# Patient Record
Sex: Female | Born: 1970 | Race: White | Hispanic: No | Marital: Married | Smoking: Former smoker
Health system: Southern US, Community
[De-identification: ages and names within clinical notes are randomized; demographics above are authoritative.]

## PROBLEM LIST (undated history)

## (undated) DIAGNOSIS — Z8719 Personal history of other diseases of the digestive system: Secondary | ICD-10-CM

## (undated) DIAGNOSIS — D249 Benign neoplasm of unspecified breast: Secondary | ICD-10-CM

## (undated) DIAGNOSIS — T7840XA Allergy, unspecified, initial encounter: Secondary | ICD-10-CM

## (undated) DIAGNOSIS — T4145XA Adverse effect of unspecified anesthetic, initial encounter: Secondary | ICD-10-CM

## (undated) DIAGNOSIS — K219 Gastro-esophageal reflux disease without esophagitis: Secondary | ICD-10-CM

## (undated) DIAGNOSIS — T8859XA Other complications of anesthesia, initial encounter: Secondary | ICD-10-CM

## (undated) HISTORY — PX: MANDIBLE SURGERY: SHX707

## (undated) HISTORY — PX: BREAST EXCISIONAL BIOPSY: SUR124

## (undated) HISTORY — DX: Allergy, unspecified, initial encounter: T78.40XA

## (undated) HISTORY — DX: Benign neoplasm of unspecified breast: D24.9

## (undated) HISTORY — DX: Gastro-esophageal reflux disease without esophagitis: K21.9

---

## 1999-03-02 ENCOUNTER — Other Ambulatory Visit: Admission: RE | Admit: 1999-03-02 | Discharge: 1999-03-02 | Payer: Self-pay | Admitting: Obstetrics and Gynecology

## 1999-07-09 HISTORY — PX: EYE SURGERY: SHX253

## 1999-07-18 ENCOUNTER — Encounter (INDEPENDENT_AMBULATORY_CARE_PROVIDER_SITE_OTHER): Payer: Self-pay | Admitting: Specialist

## 1999-07-18 ENCOUNTER — Ambulatory Visit (HOSPITAL_COMMUNITY): Admission: RE | Admit: 1999-07-18 | Discharge: 1999-07-18 | Payer: Self-pay | Admitting: *Deleted

## 1999-12-28 ENCOUNTER — Encounter: Admission: RE | Admit: 1999-12-28 | Discharge: 1999-12-28 | Payer: Self-pay | Admitting: Emergency Medicine

## 1999-12-28 ENCOUNTER — Encounter: Payer: Self-pay | Admitting: Emergency Medicine

## 2000-03-27 ENCOUNTER — Other Ambulatory Visit: Admission: RE | Admit: 2000-03-27 | Discharge: 2000-03-27 | Payer: Self-pay | Admitting: Obstetrics and Gynecology

## 2000-05-18 ENCOUNTER — Encounter: Payer: Self-pay | Admitting: Obstetrics and Gynecology

## 2000-05-18 ENCOUNTER — Inpatient Hospital Stay (HOSPITAL_COMMUNITY): Admission: AD | Admit: 2000-05-18 | Discharge: 2000-05-18 | Payer: Self-pay | Admitting: Obstetrics and Gynecology

## 2000-10-03 ENCOUNTER — Inpatient Hospital Stay (HOSPITAL_COMMUNITY): Admission: AD | Admit: 2000-10-03 | Discharge: 2000-10-05 | Payer: Self-pay | Admitting: Obstetrics & Gynecology

## 2000-11-05 ENCOUNTER — Other Ambulatory Visit: Admission: RE | Admit: 2000-11-05 | Discharge: 2000-11-05 | Payer: Self-pay | Admitting: Obstetrics and Gynecology

## 2001-11-10 ENCOUNTER — Other Ambulatory Visit: Admission: RE | Admit: 2001-11-10 | Discharge: 2001-11-10 | Payer: Self-pay | Admitting: Obstetrics and Gynecology

## 2002-06-02 ENCOUNTER — Encounter: Payer: Self-pay | Admitting: *Deleted

## 2002-06-02 ENCOUNTER — Other Ambulatory Visit: Admission: RE | Admit: 2002-06-02 | Discharge: 2002-06-02 | Payer: Self-pay | Admitting: Obstetrics and Gynecology

## 2002-06-02 ENCOUNTER — Encounter: Admission: RE | Admit: 2002-06-02 | Discharge: 2002-06-02 | Payer: Self-pay | Admitting: *Deleted

## 2002-11-15 ENCOUNTER — Other Ambulatory Visit: Admission: RE | Admit: 2002-11-15 | Discharge: 2002-11-15 | Payer: Self-pay | Admitting: Obstetrics and Gynecology

## 2003-07-09 HISTORY — PX: TUBAL LIGATION: SHX77

## 2003-09-07 ENCOUNTER — Inpatient Hospital Stay (HOSPITAL_COMMUNITY): Admission: AD | Admit: 2003-09-07 | Discharge: 2003-09-07 | Payer: Self-pay | Admitting: Obstetrics and Gynecology

## 2003-09-16 ENCOUNTER — Inpatient Hospital Stay (HOSPITAL_COMMUNITY): Admission: AD | Admit: 2003-09-16 | Discharge: 2003-09-16 | Payer: Self-pay | Admitting: Obstetrics and Gynecology

## 2003-09-23 ENCOUNTER — Inpatient Hospital Stay (HOSPITAL_COMMUNITY): Admission: AD | Admit: 2003-09-23 | Discharge: 2003-09-25 | Payer: Self-pay | Admitting: Obstetrics and Gynecology

## 2003-09-24 ENCOUNTER — Encounter (INDEPENDENT_AMBULATORY_CARE_PROVIDER_SITE_OTHER): Payer: Self-pay | Admitting: Specialist

## 2003-10-28 ENCOUNTER — Other Ambulatory Visit: Admission: RE | Admit: 2003-10-28 | Discharge: 2003-10-28 | Payer: Self-pay | Admitting: Obstetrics and Gynecology

## 2004-11-19 ENCOUNTER — Other Ambulatory Visit: Admission: RE | Admit: 2004-11-19 | Discharge: 2004-11-19 | Payer: Self-pay | Admitting: Obstetrics and Gynecology

## 2005-07-05 ENCOUNTER — Encounter: Admission: RE | Admit: 2005-07-05 | Discharge: 2005-07-05 | Payer: Self-pay | Admitting: Obstetrics and Gynecology

## 2006-05-06 ENCOUNTER — Encounter: Admission: RE | Admit: 2006-05-06 | Discharge: 2006-05-06 | Payer: Self-pay | Admitting: Family Medicine

## 2007-01-16 ENCOUNTER — Encounter: Admission: RE | Admit: 2007-01-16 | Discharge: 2007-01-16 | Payer: Self-pay | Admitting: Obstetrics and Gynecology

## 2010-11-21 ENCOUNTER — Other Ambulatory Visit: Payer: Self-pay | Admitting: Gastroenterology

## 2010-11-23 NOTE — Op Note (Signed)
NAME:  [name], Erin L                              ACCOUNT NO.:  [id number]   MEDICAL RECORD NO.:  [id number]                   PATIENT TYPE:  INP   LOCATION:  9110                                 FACILITY:  [facility]   PHYSICIAN:  Dineen Kid. Rana Snare, M.D.                 DATE OF BIRTH:  [birth year]   DATE OF PROCEDURE:  09/24/2003  DATE OF DISCHARGE:  09/25/2003                                 OPERATIVE REPORT   PREOPERATIVE DIAGNOSIS:  Multiparity, desired sterility.   POSTOPERATIVE DIAGNOSIS:  Multiparity, desired sterility.   PROCEDURE:  Modified Pomeroy bilateral tubal ligation.   SURGEON:  Dr. Candice Camp.   ANESTHESIA:  Epidural.   INDICATIONS:  [name] is a 40 year old G3 now P3 status post vaginal  delivery yesterday of an uncomplicated birth of a baby boy.  She and her  husband adamantly desire sterilization and she presents for this today.  Risks and benefits were discussed at length which include but were not  limited to risk of infection; bleeding; damage to uterus, tubes, ovaries,  bowel, and bladder.  Risk of tubal failure quoted at 11/998.  She gives her  informed consent.   DESCRIPTION OF PROCEDURE:  After adequate analgesia the patient was placed  in the supine position.  She was sterilely prepped and draped.  The incision  site was infiltrated with 0.25% Marcaine 9 mL.  A 2 cm infraumbilical skin  incision was made and taken down sharply to the fascia, incised  transversely.  The peritoneum was entered sharply.  [employer] retractors  were placed.  The right fallopian tube was identified, grasped with a  Babcock clamp, confirmed by the fimbriated end.  The midportion of the tube  was then grabbed with the Babcock, doubly ligated with 0 plain suture, and  then suture of 2-0 silk on the proximal section.  The central portion was  excised, noted to be hemostatic.  The left fallopian tube was identified,  confirmed by the fimbriated end.  The midportion of the tube was grasped,  doubly ligated with 0 plain suture and 2-0 silk suture on the proximal  section.  The midportion was then excised and sent to pathology.  Hemostasis  was achieved.  The fascia was then closed with a #1 Vicryl in a running  fashion.  The skin was then closed with a 3-0 Vicryl Rapide subcuticular  stitch with good approximation and good hemostasis.  The patient tolerated  the procedure well, was stable on transfer to the recovery room.  The  estimated blood loss was less than 10 mL.  There were no complications.                                               Dineen Kid  Rana Snare, M.D.    DCL/MEDQ  D:  09/24/2003  T:  09/26/2003  Job:  [id number]

## 2012-01-16 ENCOUNTER — Ambulatory Visit: Payer: Self-pay | Admitting: Gynecology

## 2012-02-06 ENCOUNTER — Ambulatory Visit: Payer: Self-pay | Admitting: Gynecology

## 2012-02-06 DIAGNOSIS — D249 Benign neoplasm of unspecified breast: Secondary | ICD-10-CM

## 2012-02-06 HISTORY — DX: Benign neoplasm of unspecified breast: D24.9

## 2012-02-13 ENCOUNTER — Other Ambulatory Visit: Payer: Self-pay | Admitting: Gynecology

## 2012-02-13 ENCOUNTER — Encounter: Payer: Self-pay | Admitting: Gynecology

## 2012-02-13 ENCOUNTER — Other Ambulatory Visit (HOSPITAL_COMMUNITY)
Admission: RE | Admit: 2012-02-13 | Discharge: 2012-02-13 | Disposition: A | Discharge status: Home / Self Care | Payer: BC Managed Care – PPO | Source: Ambulatory Visit | Attending: Gynecology | Admitting: Gynecology

## 2012-02-13 ENCOUNTER — Ambulatory Visit (INDEPENDENT_AMBULATORY_CARE_PROVIDER_SITE_OTHER): Payer: BC Managed Care – PPO | Admitting: Gynecology

## 2012-02-13 VITALS — BP 108/64 | Ht 61.0 in | Wt 143.0 lb

## 2012-02-13 DIAGNOSIS — Z01419 Encounter for gynecological examination (general) (routine) without abnormal findings: Secondary | ICD-10-CM

## 2012-02-13 DIAGNOSIS — Z1151 Encounter for screening for human papillomavirus (HPV): Secondary | ICD-10-CM | POA: Insufficient documentation

## 2012-02-13 DIAGNOSIS — Z131 Encounter for screening for diabetes mellitus: Secondary | ICD-10-CM

## 2012-02-13 DIAGNOSIS — N926 Irregular menstruation, unspecified: Secondary | ICD-10-CM

## 2012-02-13 DIAGNOSIS — Z13220 Encounter for screening for lipoid disorders: Secondary | ICD-10-CM

## 2012-02-13 DIAGNOSIS — Z1231 Encounter for screening mammogram for malignant neoplasm of breast: Secondary | ICD-10-CM

## 2012-02-13 LAB — CBC WITH DIFFERENTIAL/PLATELET
Basophils Relative: 1 % (ref 0–1)
Eosinophils Relative: 1 % (ref 0–5)
HCT: 40 % (ref 36.0–46.0)
Hemoglobin: 13.6 g/dL (ref 12.0–15.0)
MCH: 28.8 pg (ref 26.0–34.0)
MCHC: 34 g/dL (ref 30.0–36.0)
MCV: 84.7 fL (ref 78.0–100.0)
Monocytes Absolute: 0.4 10*3/uL (ref 0.1–1.0)
Monocytes Relative: 6 % (ref 3–12)
Neutro Abs: 4.4 10*3/uL (ref 1.7–7.7)

## 2012-02-13 LAB — LIPID PANEL
Cholesterol: 177 mg/dL (ref 0–200)
Total CHOL/HDL Ratio: 3.9 Ratio
VLDL: 27 mg/dL (ref 0–40)

## 2012-02-13 LAB — PROLACTIN: Prolactin: 6.1 ng/mL

## 2012-02-13 LAB — FOLLICLE STIMULATING HORMONE: FSH: 9.8 m[IU]/mL

## 2012-02-13 MED ORDER — ALPRAZOLAM 0.5 MG PO TABS: 0.5000 mg | ORAL_TABLET | Freq: Every evening | ORAL | Status: AC | PRN

## 2012-02-13 NOTE — Patient Instructions (Signed)
Follow up for ultrasound as scheduled 

## 2012-02-13 NOTE — Progress Notes (Signed)
Erin [name] 1970/09/25 [id number]        41 y.o.  G3P3 for annual exam.  Has not been in this office for a number of years. Several issues noted below.  Past medical history,surgical history, medications, allergies, family history and social history were all reviewed and documented in the EPIC chart. ROS:  Was performed and pertinent positives and negatives are included in the history.  Exam: Sherrilyn Rist assistant Filed Vitals:   02/13/12 0916  BP: 108/64  Height: 5\' 1"  (1.549 m)  Weight: 143 lb (64.864 kg)   General appearance  Normal Skin grossly normal Head/Neck normal with no cervical or supraclavicular adenopathy thyroid normal Lungs  clear Cardiac RR, without RMG Abdominal  soft, nontender, without masses, organomegaly or hernia Breasts  examined lying and sitting without masses, retractions, discharge or axillary adenopathy. Pelvic  Ext/BUS/vagina  normal   Cervix  normal Pap/HPV  Uterus  anteverted, normal size, shape and contour, midline and mobile nontender   Adnexa  Without masses or tenderness    Anus and perineum  normal   Rectovaginal  normal sphincter tone without palpated masses or tenderness.    Assessment/Plan:  41 y.o. G3P3 female for annual exam, BTL postpartum.  1. Irregular menses. Patient notes that her menses are becoming more irregular with a will vary as to when they start each month. She does have them monthly lasting 3-5 days. They do vary in flow from month to month. No intermenstrual bleeding. Status post BTL. We'll check baseline labs to include FSH TSH prolactin.  2. History right ovarian cyst. Patient does note her periods will vary in discomfort from mild to significant dysmenorrhea. History of intermittent pelvic pain that she attributes to recurrent ovarian cysts. No dyspareunia or consistent chronic pain. We'll start with ultrasound. Given the irregular menses history also will do sonohysterogram for pelvic assessment. 3. PMS. Patient notes the week before  her menses increasing short temper and anxiety. Had been on Xanax in the past which seemed to help. Options to include behavior modification, intermittent Xanax, daily anxiolytic such as fluoxetine all reviewed. Patient wants to retry Xanax I prescribed Xanax 0.5 mg, one half tab every 6 hours when necessary #30 with 1 refill. 4. Breast health. Overdue for mammogram and knows to schedule this and agrees to call to do so. SBE monthly reviewed. 5. Pap smear. Several years since her last Pap smear. Pap/HPV done. She has no history of abnormal Pap smears before that this is normal we'll plan every 5 year screening per current screening guidelines. 6. Health maintenance. Baseline CBC lipid profile glucose urinalysis ordered with the above labwork. Follow up for ultrasound and lab work results.   Dara Lords MD, 9:57 AM 02/13/2012

## 2012-02-14 LAB — URINALYSIS W MICROSCOPIC + REFLEX CULTURE
Bacteria, UA: NONE SEEN
Bilirubin Urine: NEGATIVE
Ketones, ur: NEGATIVE mg/dL
Protein, ur: NEGATIVE mg/dL
Urobilinogen, UA: 0.2 mg/dL (ref 0.0–1.0)

## 2012-02-18 ENCOUNTER — Ambulatory Visit
Admission: RE | Admit: 2012-02-18 | Discharge: 2012-02-18 | Disposition: A | Discharge status: Home / Self Care | Payer: BC Managed Care – PPO | Source: Ambulatory Visit | Attending: Gynecology | Admitting: Gynecology

## 2012-02-18 DIAGNOSIS — Z1231 Encounter for screening mammogram for malignant neoplasm of breast: Secondary | ICD-10-CM

## 2012-02-20 ENCOUNTER — Other Ambulatory Visit: Payer: Self-pay | Admitting: Gynecology

## 2012-02-20 ENCOUNTER — Ambulatory Visit (INDEPENDENT_AMBULATORY_CARE_PROVIDER_SITE_OTHER): Payer: BC Managed Care – PPO

## 2012-02-20 ENCOUNTER — Ambulatory Visit (INDEPENDENT_AMBULATORY_CARE_PROVIDER_SITE_OTHER): Payer: BC Managed Care – PPO | Admitting: Gynecology

## 2012-02-20 ENCOUNTER — Encounter: Payer: Self-pay | Admitting: Gynecology

## 2012-02-20 DIAGNOSIS — N926 Irregular menstruation, unspecified: Secondary | ICD-10-CM

## 2012-02-20 DIAGNOSIS — N831 Corpus luteum cyst of ovary, unspecified side: Secondary | ICD-10-CM

## 2012-02-20 DIAGNOSIS — N920 Excessive and frequent menstruation with regular cycle: Secondary | ICD-10-CM

## 2012-02-20 DIAGNOSIS — N8300 Follicular cyst of ovary, unspecified side: Secondary | ICD-10-CM

## 2012-02-20 NOTE — Patient Instructions (Signed)
Office will call you with the biopsy results. Assuming negative then plan to keep a menstrual calendar. We'll see how your periods do in the next 3-6 months.

## 2012-02-20 NOTE — Progress Notes (Signed)
Patient presents for sonohysterogram due to her menses are becoming more irregular where they will vary as to when they start each month. She does have them monthly lasting 3-5 days. They do vary in flow from month to month.  She also has a history of recurrent ovarian cysts with some pelvic discomfort that comes and goes.  Ultrasound shows anteverted uterus with homogeneous myometrial echo texture. Uterine size normal. Endometrial echo 8.5 mm. Right ovary normal. Left ovary with classic corpus luteal cyst. Cul-de-sac negative. Sonohysterogram performed, sterile technique, easy catheter introduction, good distention with no abnormalities. Endometrial sample taken. Patient tolerated well.  Assessment and plan: Mild menstrual irregularity. Varies in when her menses will start each month and as far as flow. Hormone levels to include TSH prolactin FSH were normal. Sonohysterogram negative for cavity abnormalities. Patient will follow up for endometrial biopsy results. Assuming negative options for management include hormonal manipulation Mirena IUD observation reviewed. Patient would prefer observation at this point. She'll call if her menses become significantly irregular or other symptoms present.

## 2012-02-24 ENCOUNTER — Other Ambulatory Visit: Payer: Self-pay | Admitting: *Deleted

## 2012-02-24 DIAGNOSIS — R928 Other abnormal and inconclusive findings on diagnostic imaging of breast: Secondary | ICD-10-CM

## 2012-02-26 NOTE — Addendum Note (Signed)
Addended by: Aura Camps on: 02/26/2012 10:10 AM   Modules accepted: Orders

## 2012-02-27 ENCOUNTER — Other Ambulatory Visit: Payer: Self-pay | Admitting: *Deleted

## 2012-02-27 DIAGNOSIS — N63 Unspecified lump in unspecified breast: Secondary | ICD-10-CM

## 2012-02-28 ENCOUNTER — Ambulatory Visit
Admission: RE | Admit: 2012-02-28 | Discharge: 2012-02-28 | Disposition: A | Discharge status: Home / Self Care | Payer: BC Managed Care – PPO | Source: Ambulatory Visit | Attending: Gynecology | Admitting: Gynecology

## 2012-02-28 DIAGNOSIS — R928 Other abnormal and inconclusive findings on diagnostic imaging of breast: Secondary | ICD-10-CM

## 2012-03-03 ENCOUNTER — Other Ambulatory Visit: Payer: BC Managed Care – PPO

## 2012-03-05 ENCOUNTER — Telehealth: Payer: Self-pay | Admitting: *Deleted

## 2012-03-05 NOTE — Telephone Encounter (Signed)
Pt informed of all annual lab results.

## 2012-03-11 ENCOUNTER — Encounter: Payer: Self-pay | Admitting: Gynecology

## 2012-07-08 HISTORY — PX: BREAST LUMPECTOMY: SHX2

## 2012-07-08 HISTORY — PX: BREAST BIOPSY: SHX20

## 2012-08-31 ENCOUNTER — Ambulatory Visit
Admission: RE | Admit: 2012-08-31 | Discharge: 2012-08-31 | Disposition: A | Discharge status: Home / Self Care | Payer: BC Managed Care – PPO | Source: Ambulatory Visit | Attending: Gynecology | Admitting: Gynecology

## 2012-08-31 ENCOUNTER — Other Ambulatory Visit: Payer: Self-pay | Admitting: Gynecology

## 2012-08-31 ENCOUNTER — Other Ambulatory Visit: Payer: Self-pay | Admitting: *Deleted

## 2012-08-31 DIAGNOSIS — N63 Unspecified lump in unspecified breast: Secondary | ICD-10-CM

## 2012-09-02 ENCOUNTER — Ambulatory Visit
Admission: RE | Admit: 2012-09-02 | Discharge: 2012-09-02 | Disposition: A | Discharge status: Home / Self Care | Payer: BC Managed Care – PPO | Source: Ambulatory Visit | Attending: Gynecology | Admitting: Gynecology

## 2012-09-02 ENCOUNTER — Other Ambulatory Visit: Payer: Self-pay | Admitting: Gynecology

## 2012-09-02 DIAGNOSIS — N6310 Unspecified lump in the right breast, unspecified quadrant: Secondary | ICD-10-CM

## 2012-12-31 ENCOUNTER — Other Ambulatory Visit: Payer: Self-pay

## 2012-12-31 MED ORDER — ALPRAZOLAM 0.5 MG PO TABS: 0.5000 mg | ORAL_TABLET | Freq: Every evening | ORAL | Status: DC | PRN

## 2012-12-31 NOTE — Telephone Encounter (Signed)
Called into pharmacy

## 2012-12-31 NOTE — Telephone Encounter (Signed)
On 02/17/13 visit you wrote "PMS. Patient notes the week before her menses increasing short temper and anxiety. Had been on Xanax in the past which seemed to help. Options to include behavior modification, intermittent Xanax, daily anxiolytic such as fluoxetine all reviewed. Patient wants to retry Xanax I prescribed Xanax 0.5 mg, one half tab every 6 hours when necessary #30 with 1 refill."

## 2013-02-12 ENCOUNTER — Other Ambulatory Visit: Payer: Self-pay | Admitting: Gynecology

## 2013-02-12 DIAGNOSIS — N63 Unspecified lump in unspecified breast: Secondary | ICD-10-CM

## 2013-03-02 ENCOUNTER — Other Ambulatory Visit: Payer: Self-pay | Admitting: Gynecology

## 2013-03-02 ENCOUNTER — Ambulatory Visit
Admission: RE | Admit: 2013-03-02 | Discharge: 2013-03-02 | Disposition: A | Discharge status: Home / Self Care | Payer: BC Managed Care – PPO | Source: Ambulatory Visit | Attending: Gynecology | Admitting: Gynecology

## 2013-03-02 DIAGNOSIS — N63 Unspecified lump in unspecified breast: Secondary | ICD-10-CM

## 2013-03-11 ENCOUNTER — Ambulatory Visit (INDEPENDENT_AMBULATORY_CARE_PROVIDER_SITE_OTHER): Payer: Self-pay | Admitting: General Surgery

## 2013-03-12 ENCOUNTER — Other Ambulatory Visit (INDEPENDENT_AMBULATORY_CARE_PROVIDER_SITE_OTHER): Payer: Self-pay | Admitting: General Surgery

## 2013-03-12 ENCOUNTER — Ambulatory Visit (INDEPENDENT_AMBULATORY_CARE_PROVIDER_SITE_OTHER): Payer: BC Managed Care – PPO | Admitting: General Surgery

## 2013-03-12 ENCOUNTER — Encounter (INDEPENDENT_AMBULATORY_CARE_PROVIDER_SITE_OTHER): Payer: Self-pay | Admitting: General Surgery

## 2013-03-12 VITALS — BP 130/80 | HR 68 | Temp 98.4°F | Resp 14 | Ht 61.5 in | Wt 142.2 lb

## 2013-03-12 DIAGNOSIS — N6310 Unspecified lump in the right breast, unspecified quadrant: Secondary | ICD-10-CM

## 2013-03-12 DIAGNOSIS — N63 Unspecified lump in unspecified breast: Secondary | ICD-10-CM

## 2013-03-12 NOTE — Patient Instructions (Signed)
You have a palpable mass in the right breast in the lower outer quadrant. This has been slowly enlarging. It is probably not cancer, but  complete removal of this is recommended because of its enlargement.  You'll be scheduled for a right lumpectomy with needle localization in the near future.      Breast Biopsy A breast biopsy is a procedure where a sample of breast tissue is removed from your breast. The tissue is examined under a microscope to see if cancerous cells are present. A breast biopsy is done when there is:  Any undiagnosed breast mass (tumor).  Nipple abnormalities, dimpling, crusting, or ulcerations.  Abnormal discharge from the nipple, especially blood.  Redness, swelling, and pain of the breast.  Calcium deposits (calcifications) or abnormalities seen on a mammogram, ultrasound result, or results of magnetic resonance imaging (MRI).  Suspicious changes in the breast seen on your mammogram. If the tumor is found to be cancerous (malignant), a breast biopsy can help to determine what the best treatment is for you. There are many different types of breast biopsies. Talk to your caregiver about your options and which type is best for you. LET YOUR CAREGIVER KNOW ABOUT:  Allergies to food or medicine.  Medicines taken, including vitamins, herbs, eyedrops, over-the-counter medicines, and creams.  Use of steroids (by mouth or creams).  Previous problems with anesthetics or numbing medicines.  History of bleeding problems or blood clots.  Previous surgery.  Other health problems, including diabetes and kidney problems.  Any recent colds or infections.  Possibility of pregnancy, if this applies. RISKS AND COMPLICATIONS   Bleeding.  Infection.  Allergy to medicines.  Bruising and swelling of the breast.  Alteration in the shape of the breast.  Not finding the lump or abnormality.  Needing more surgery. BEFORE THE PROCEDURE  Arrange for someone to  drive you home after the procedure.  Do not smoke for 2 weeks before the procedure. Stop smoking, if you smoke.  Do not drink alcohol for 24 hours before procedure.  Wear a good support bra to the procedure. PROCEDURE  You may be given a medicine to numb the breast area (local anesthesia) or a medicine to make you sleep (general anesthesia) during the procedure. The following are the different types of biopsies that can be performed.   Fine-needle aspiration A thin needle is attached to a syringe and inserted into the breast lump. Fluid and cells are removed and then looked at under a microscope. If the breast lump cannot be felt, an ultrasound may be used to help locate the lump and place the needle in the correct area.   Core needle biopsy A wide, hollow needle (core needle) is inserted into the breast lump 3 6 times to get tissue samples or cores. The samples are removed. The needle is usually placed in the correct area by using an ultrasound or X-ray.   Stereotactic biopsy X-ray equipment and a computer are used to analyze X-ray pictures of the breast lump. The computer then finds exactly where the core needle needs to be inserted. Tissue samples are removed.   Vacuum-assisted biopsy A small incision (less than  inch) is made in your breast. A biopsy device that includes a hollow needle and vacuum is passed through the incision and into the breast tissue. The vacuum gently draws abnormal breast tissue into the needle to remove it. This type of biopsy removes a larger tissue sample than a regular core needle biopsy. No stitches are  needed, and there is usually little scarring.  Ultrasound-guided core needle biopsy A high frequency ultrasound helps guide the core needle to the area of the mass or abnormality. An incision is made to insert the needle. Tissue samples are removed.  Open biopsy A larger incision is made in the breast. Your caregiver will attempt to remove the whole breast lump  or as much as possible. AFTER THE PROCEDURE  You will be taken to the recovery area. If you are doing well and have no problems, you will be allowed to go home.  You may notice bruising on your breast. This is normal.  Your caregiver may apply a pressure dressing on your breast for 24 48 hours. A pressure dressing is a bandage that is wrapped tightly around the chest to stop fluid from collecting underneath tissues. Document Released: 06/24/2005 Document Revised: 12/24/2011 Document Reviewed: 07/25/2011 [facility] Patient Information 2014 [place], [place].

## 2013-03-12 NOTE — Progress Notes (Signed)
Patient ID: Erin [name], female   DOB: 07-Jan-1971, 42 y.o.   MRN: [id number]  Chief Complaint  Patient presents with  . New Evaluation    eval mass in rt breast    HPI Erin [name] is a 42 y.o. female.  She is referred by Dr. Konrad Saha at [facility] for evaluation and surgical management of an enlarging right breast mass at the 7:00 position.  The patient has not had any prior breast problems. 2 years ago with a solid density on her mammograms and had been washing it. 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 Six-month followup was advised and followup mammogram and ultrasound on 03/02/2013 shows slight enlargement. There is elliptical reasonably well marginated mass in the 7:00 position right breast which has gone from 2.7 x 2.1 x 1.1 cm, up to 2.8 x 2.4 x 1.6 cm.   Her only symptoms are a little bit of soreness and tenderness. No skin changes. No nipple discharge.  Family history is negative for breast or ovarian cancer.  The patient is otherwise pretty healthy. She takes Xanax for mild anxiety. She has 3 sons. Her husband is here with her today. HPI  Past Medical History  Diagnosis Date  . 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 GERD (gastroesophageal reflux disease)     Past Surgical History  Procedure Laterality Date  . Mandible surgery    . Tubal ligation  2005    postpartum    Family History  Problem Relation Age of Onset  . Hypertension Mother   . Hyperlipidemia Mother   . Diabetes Mother   . Hyperlipidemia Maternal Grandmother   . Hypertension Maternal Grandmother   . Diabetes Maternal Grandmother   . Hypertension Maternal Grandfather   . Hyperlipidemia Maternal Grandfather   . Diabetes Maternal Grandfather     Social History History  Substance Use Topics  . Smoking status: Former Smoker    Quit date: 07/09/2007    . Smokeless tobacco: Never Used     Comment: quit 2008  . Alcohol Use: Yes     Comment: very rarely    Allergies  Allergen Reactions  . Penicillins Rash    Current Outpatient Prescriptions  Medication Sig Dispense Refill  . ALPRAZolam (XANAX) 0.5 MG tablet Take 1 tablet (0.5 mg total) by mouth at bedtime as needed for sleep.  30 tablet  2   No current facility-administered medications for this visit.    Review of Systems Review of Systems  Constitutional: Negative for fever, chills and unexpected weight change.  HENT: Negative for hearing loss, congestion, sore throat, trouble swallowing and voice change.   Eyes: Negative for visual disturbance.  Respiratory: Negative for cough and wheezing.   Cardiovascular: Negative for chest pain, palpitations and leg swelling.  Gastrointestinal: Negative for nausea, vomiting, abdominal pain, diarrhea, constipation, blood in stool, abdominal distention and anal bleeding.  Genitourinary: Negative for hematuria, vaginal bleeding and difficulty urinating.  Musculoskeletal: Negative for arthralgias.  Skin: Negative for rash and wound.  Neurological: Negative for seizures, syncope and headaches.  Hematological: Negative for adenopathy. Does not bruise/bleed easily.  Psychiatric/Behavioral: Negative for confusion. The patient is nervous/anxious.     Blood pressure 130/80, pulse 68, temperature 98.4 F (36.9 C), temperature source Temporal, resp. rate 14, height 5' 1.5" (1.562 m), weight 142 lb 3.2 oz (64.501 kg).  Physical Exam Physical  Exam  Constitutional: She is oriented to person, place, and time. She appears well-developed and well-nourished. No distress.  HENT:  Head: Normocephalic and atraumatic.  Nose: Nose normal.  Mouth/Throat: No oropharyngeal exudate.  Eyes: Conjunctivae and EOM are normal. Pupils are equal, round, and reactive to light. Left eye exhibits no discharge. No scleral icterus.  Neck: Neck supple. No JVD present. No  tracheal deviation present. No thyromegaly present.  Cardiovascular: Normal rate, regular rhythm, normal heart sounds and intact distal pulses.   No murmur heard. Pulmonary/Chest: Effort normal and breath sounds normal. No respiratory distress. She has no wheezes. She has no rales. She exhibits no tenderness.    2.5 x 1.5 cm transversely elliptical mass in the right breast at the 7:00 position. No overlying skin change. No other masses in either breast. No axillary adenopathy.  Abdominal: Soft. Bowel sounds are normal. She exhibits no distension and no mass. There is no tenderness. There is no rebound and no guarding.  Musculoskeletal: She exhibits no edema and no tenderness.  Lymphadenopathy:    She has no cervical adenopathy.  Neurological: She is alert and oriented to person, place, and time. She exhibits normal muscle tone. Coordination normal.  Skin: Skin is warm. No rash noted. She is not diaphoretic. No erythema. No pallor.  Psychiatric: She has a normal mood and affect. Her behavior is normal. Judgment and thought content normal.    Data Reviewed Mammograms, ultrasound, pathology report  Assessment    Right breast mass at 7:00 position. Slowly enlarging. 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 Excision is recommended to do to enlargement, but most likely this will be benign.     Plan    Scheduled for a right lumpectomy with needle localization in the near future.  I discussed the indications, details, techniques, and numerous risk of the surgery with her and her husband. She is aware the risks of bleeding, infection, cosmetic deformity, reoperation if cancer is found and other unforeseen problems patient is aware that I will  be fairly conservative with the excision to avoid deformity and she is comfortable with that. All of her questions are answered. She understands all these issues. She agrees with this plan.        Angelia Mould. Derrell Lolling, M.D., [facility] General and Minimally invasive Surgery Breast and Colorectal Surgery Office:   [phone] Pager:   [phone]  03/12/2013, 3:34 PM

## 2013-03-22 ENCOUNTER — Encounter (HOSPITAL_COMMUNITY): Payer: Self-pay | Admitting: Pharmacy Technician

## 2013-03-23 NOTE — Pre-Procedure Instructions (Signed)
Erin [name]  03/23/2013   Your procedure is scheduled on: Wednesday, September 24th.  Report to [facility], Main Entrance o/ Entrance "A"* at 11:15 AM.  Call this number if you have problems the morning of surgery: [phone]   Remember:   Do not eat food or drink liquids after midnight.   Take these medicines the morning of surgery with A SIP OF WATER: Xanax if needed.   Do not wear jewelry, make-up or nail polish.  Do not wear lotions, powders, or perfumes. You may wear deodorant.  Do not shave 48 hours prior to surgery.   Do not bring valuables to the hospital.  [facility] is not responsible for any belongings or valuables.  Contacts, dentures or bridgework may not be worn into surgery.  Leave suitcase in the car. After surgery it may be brought to your room.  For patients admitted to the hospital, checkout time is 11:00 AM the day of discharge.   Patients discharged the day of surgery will not be allowed to drive home.  Name and phone number of your driver: -   Special Instructions: Shower using CHG 2 nights before surgery and the night before surgery.  If you shower the day of surgery use CHG.  Use special wash - you have one bottle of CHG for all showers.  You should use approximately 1/3 of the bottle for each shower.   Please read over the following fact sheets that you were given: Pain Booklet, Coughing and Deep Breathing and Surgical Site Infection Prevention

## 2013-03-24 ENCOUNTER — Encounter (HOSPITAL_COMMUNITY): Payer: Self-pay

## 2013-03-24 ENCOUNTER — Encounter (HOSPITAL_COMMUNITY)
Admission: RE | Admit: 2013-03-24 | Discharge: 2013-03-24 | Disposition: A | Discharge status: Home / Self Care | Payer: BC Managed Care – PPO | Source: Ambulatory Visit | Attending: General Surgery | Admitting: General Surgery

## 2013-03-24 ENCOUNTER — Telehealth (INDEPENDENT_AMBULATORY_CARE_PROVIDER_SITE_OTHER): Payer: Self-pay

## 2013-03-24 DIAGNOSIS — Z01812 Encounter for preprocedural laboratory examination: Secondary | ICD-10-CM | POA: Insufficient documentation

## 2013-03-24 DIAGNOSIS — Z01818 Encounter for other preprocedural examination: Secondary | ICD-10-CM | POA: Insufficient documentation

## 2013-03-24 HISTORY — DX: Other complications of anesthesia, initial encounter: T88.59XA

## 2013-03-24 HISTORY — DX: Adverse effect of unspecified anesthetic, initial encounter: T41.45XA

## 2013-03-24 HISTORY — DX: Personal history of other diseases of the digestive system: Z87.19

## 2013-03-24 LAB — CBC
HCT: 39.9 % (ref 36.0–46.0)
MCH: 29.3 pg (ref 26.0–34.0)
MCV: 85.4 fL (ref 78.0–100.0)
Platelets: 235 10*3/uL (ref 150–400)
RBC: 4.67 MIL/uL (ref 3.87–5.11)
RDW: 11.9 % (ref 11.5–15.5)

## 2013-03-24 NOTE — Telephone Encounter (Signed)
That would be fine if approved with anesthesia.  The patient's instructions should be documented in Epic by anesthesia and by [facility] to avoid misunderstanding and last minute cancellation by anesthesia.   hmi

## 2013-03-24 NOTE — Telephone Encounter (Signed)
Jane from pre admit called stating patient getting jittery and nauseous if she doesn't eat. Her surgery is not until 1 in the afternoon. Pre admit and anesthesiologist want to know if Dr Derrell Lolling would be okay if patient has a small clear breakfast that morning before surgery. She can be reached at [phone].

## 2013-03-24 NOTE — Progress Notes (Signed)
Pt states that she gets shaky when she doesn't eat (denies hypoglycemia).  Pt said  she was told that we would instruct her.  I called Dr Aura Camps office and spoke with Marcelino Duster.  Marcelino Duster will  Renette Butters Dr Derrell Lolling a message and get back to me.

## 2013-03-24 NOTE — Progress Notes (Signed)
I spoke with Erin [name] and she said patient could have clear liquids until 0700.  I notified patient and she repeated this to me and what clear liquids consist of.

## 2013-03-24 NOTE — Telephone Encounter (Signed)
I called Erskine Squibb back and told her Per Dr Derrell Lolling he is okay with a clear breakfast am of surgery when she gets up but that she would have to check with anesthesia and okay it by them.

## 2013-03-26 NOTE — H&P (Signed)
Erin [name]   MRN:  [id number]   Description: 42 year old female  Provider: [name], MD  Department: [facility]        Diagnoses    Breast mass, right    -  Primary    611.72      Reason for Visit    New Evaluation    eval mass in rt breast        Current Vitals - Last Recorded    BP Pulse Temp(Src) Resp Ht Wt    130/80 68 98.4 F (36.9 C) (Temporal) 14 5' 1.5" (1.562 m) 142 lb 3.2 oz (64.501 kg)    BMI - 26.44 kg/m2                                       HPI Erin [name] is a 42 y.o. female.  She is referred by Dr. Konrad Saha at [facility] for evaluation and surgical management of an enlarging right breast mass at the 7:00 position.   The patient has not had any prior breast problems. 2 years ago with a solid density on her mammograms and had been washing it. 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 Six-month followup was advised and followup mammogram and ultrasound on 03/02/2013 shows slight enlargement. There is elliptical reasonably well marginated mass in the 7:00 position right breast which has gone from 2.7 x 2.1 x 1.1 cm, up to 2.8 x 2.4 x 1.6 cm.    Her only symptoms are a little bit of soreness and tenderness. No skin changes. No nipple discharge.   Family history is negative for breast or ovarian cancer.   The patient is otherwise pretty healthy. She takes Xanax for mild anxiety. She has 3 sons. Her husband is here with her today.       Past Medical History   Diagnosis  Date   .  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  GERD (gastroesophageal reflux disease)           Past Surgical History   Procedure  Laterality  Date   .  Mandible surgery       .  Tubal ligation    2005       postpartum         Family History   Problem  Relation  Age of Onset   .  Hypertension   Mother     .  Hyperlipidemia  Mother     .  Diabetes  Mother     .  Hyperlipidemia  Maternal Grandmother     .  Hypertension  Maternal Grandmother     .  Diabetes  Maternal Grandmother     .  Hypertension  Maternal Grandfather     .  Hyperlipidemia  Maternal Grandfather     .  Diabetes  Maternal Grandfather          Social History History   Substance Use Topics   .  Smoking status:  Former Smoker       Quit date:  07/09/2007   .  Smokeless tobacco:  Never Used         Comment: quit 2008   .  Alcohol Use:  Yes  Comment: very rarely         Allergies   Allergen  Reactions   .  Penicillins  Rash         Current Outpatient Prescriptions   Medication  Sig  Dispense  Refill   .  ALPRAZolam (XANAX) 0.5 MG tablet  Take 1 tablet (0.5 mg total) by mouth at bedtime as needed for sleep.   30 tablet   2             Review of Systems  Constitutional: Negative for fever, chills and unexpected weight change.  HENT: Negative for hearing loss, congestion, sore throat, trouble swallowing and voice change.   Eyes: Negative for visual disturbance.  Respiratory: Negative for cough and wheezing.   Cardiovascular: Negative for chest pain, palpitations and leg swelling.  Gastrointestinal: Negative for nausea, vomiting, abdominal pain, diarrhea, constipation, blood in stool, abdominal distention and anal bleeding.  Genitourinary: Negative for hematuria, vaginal bleeding and difficulty urinating.  Musculoskeletal: Negative for arthralgias.  Skin: Negative for rash and wound.  Neurological: Negative for seizures, syncope and headaches.  Hematological: Negative for adenopathy. Does not bruise/bleed easily.  Psychiatric/Behavioral: Negative for confusion. The patient is nervous/anxious.       Blood pressure 130/80, pulse 68, temperature 98.4 F (36.9 C), temperature source Temporal, resp. rate 14, height 5' 1.5" (1.562 m), weight 142 lb 3.2 oz (64.501 kg).   Physical  Exam  Constitutional: She is oriented to person, place, and time. She appears well-developed and well-nourished. No distress.  HENT:   Head: Normocephalic and atraumatic.   Nose: Nose normal.   Mouth/Throat: No oropharyngeal exudate.  Eyes: Conjunctivae and EOM are normal. Pupils are equal, round, and reactive to light. Left eye exhibits no discharge. No scleral icterus.  Neck: Neck supple. No JVD present. No tracheal deviation present. No thyromegaly present.  Cardiovascular: Normal rate, regular rhythm, normal heart sounds and intact distal pulses.    No murmur heard. Pulmonary/Chest: Effort normal and breath sounds normal. No respiratory distress. She has no wheezes. She has no rales. She exhibits no tenderness.    2.5 x 1.5 cm transversely elliptical mass in the right breast at the 7:00 position. No overlying skin change. No other masses in either breast. No axillary adenopathy.  Abdominal: Soft. Bowel sounds are normal. She exhibits no distension and no mass. There is no tenderness. There is no rebound and no guarding.  Musculoskeletal: She exhibits no edema and no tenderness.  Lymphadenopathy:    She has no cervical adenopathy.  Neurological: She is alert and oriented to person, place, and time. She exhibits normal muscle tone. Coordination normal.  Skin: Skin is warm. No rash noted. She is not diaphoretic. No erythema. No pallor.  Psychiatric: She has a normal mood and affect. Her behavior is normal. Judgment and thought content normal.      Data Reviewed Mammograms, ultrasound, pathology report   Assessment    Right breast mass at 7:00 position. Slowly enlarging. 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 Excision is recommended to do to enlargement, but most likely this will be benign.      Plan    Scheduled for a right lumpectomy with needle localization in the near future.   I discussed the indications, details, techniques, and numerous risk of the surgery with her  and her husband. She is aware the risks of bleeding, infection, cosmetic deformity, reoperation if cancer is found and other unforeseen problems patient is aware that I will  be  fairly conservative with the excision to avoid deformity and she is comfortable with that. All of her questions are answered. She understands all these issues. She agrees with this plan.           Angelia Mould. Derrell Lolling, M.D., [facility] General and Minimally invasive Surgery Breast and Colorectal Surgery Office:   [phone] Pager:   [phone]

## 2013-03-30 MED ORDER — CHLORHEXIDINE GLUCONATE 4 % EX LIQD: 1.0000 "application " | Freq: Once | CUTANEOUS | Status: DC

## 2013-03-30 MED ORDER — VANCOMYCIN HCL IN DEXTROSE 1-5 GM/200ML-% IV SOLN
1000.0000 mg | INTRAVENOUS | Status: AC
  Administered 2013-03-31: 1000 mg via INTRAVENOUS
  Filled 2013-03-30: qty 200

## 2013-03-31 ENCOUNTER — Encounter (HOSPITAL_COMMUNITY)
Admission: RE | Disposition: A | Discharge status: Home / Self Care | Payer: Self-pay | Source: Ambulatory Visit | Attending: General Surgery

## 2013-03-31 ENCOUNTER — Ambulatory Visit
Admission: RE | Admit: 2013-03-31 | Discharge: 2013-03-31 | Disposition: A | Discharge status: Home / Self Care | Payer: BC Managed Care – PPO | Source: Ambulatory Visit | Attending: General Surgery | Admitting: General Surgery

## 2013-03-31 ENCOUNTER — Ambulatory Visit (HOSPITAL_COMMUNITY)
Admission: RE | Admit: 2013-03-31 | Discharge: 2013-03-31 | Disposition: A | Discharge status: Home / Self Care | Payer: BC Managed Care – PPO | Source: Ambulatory Visit | Attending: General Surgery | Admitting: General Surgery

## 2013-03-31 ENCOUNTER — Encounter (HOSPITAL_COMMUNITY): Payer: Self-pay | Admitting: Critical Care Medicine

## 2013-03-31 ENCOUNTER — Ambulatory Visit (HOSPITAL_COMMUNITY): Payer: BC Managed Care – PPO | Admitting: Critical Care Medicine

## 2013-03-31 ENCOUNTER — Other Ambulatory Visit (INDEPENDENT_AMBULATORY_CARE_PROVIDER_SITE_OTHER): Payer: Self-pay | Admitting: General Surgery

## 2013-03-31 DIAGNOSIS — N6310 Unspecified lump in the right breast, unspecified quadrant: Secondary | ICD-10-CM

## 2013-03-31 DIAGNOSIS — N6019 Diffuse cystic mastopathy of unspecified breast: Secondary | ICD-10-CM | POA: Insufficient documentation

## 2013-03-31 DIAGNOSIS — N63 Unspecified lump in unspecified breast: Secondary | ICD-10-CM | POA: Insufficient documentation

## 2013-03-31 DIAGNOSIS — D249 Benign neoplasm of unspecified breast: Secondary | ICD-10-CM

## 2013-03-31 HISTORY — PX: BREAST LUMPECTOMY WITH NEEDLE LOCALIZATION: SHX5759

## 2013-03-31 SURGERY — BREAST LUMPECTOMY WITH NEEDLE LOCALIZATION
Anesthesia: General | Site: Breast | Laterality: Right | Wound class: Clean

## 2013-03-31 MED ORDER — LIDOCAINE-EPINEPHRINE (PF) 1 %-1:200000 IJ SOLN
INTRAMUSCULAR | Status: AC
  Filled 2013-03-31: qty 10

## 2013-03-31 MED ORDER — SODIUM CHLORIDE 0.9 % IV SOLN: INTRAVENOUS | Status: DC

## 2013-03-31 MED ORDER — SODIUM CHLORIDE 0.9 % IJ SOLN: 3.0000 mL | Freq: Two times a day (BID) | INTRAMUSCULAR | Status: DC

## 2013-03-31 MED ORDER — HYDROMORPHONE HCL PF 1 MG/ML IJ SOLN: 0.2500 mg | INTRAMUSCULAR | Status: DC | PRN

## 2013-03-31 MED ORDER — HYDROCODONE-ACETAMINOPHEN 5-325 MG PO TABS: 1.0000 | ORAL_TABLET | ORAL | Status: DC | PRN

## 2013-03-31 MED ORDER — ONDANSETRON HCL 4 MG/2ML IJ SOLN
INTRAMUSCULAR | Status: DC | PRN
  Administered 2013-03-31: 4 mg via INTRAVENOUS

## 2013-03-31 MED ORDER — OXYCODONE HCL 5 MG/5ML PO SOLN: 5.0000 mg | Freq: Once | ORAL | Status: DC | PRN

## 2013-03-31 MED ORDER — OXYCODONE HCL 5 MG PO TABS: 5.0000 mg | ORAL_TABLET | Freq: Once | ORAL | Status: DC | PRN

## 2013-03-31 MED ORDER — 0.9 % SODIUM CHLORIDE (POUR BTL) OPTIME
TOPICAL | Status: DC | PRN
  Administered 2013-03-31: 1000 mL

## 2013-03-31 MED ORDER — SODIUM CHLORIDE 0.9 % IV SOLN: 250.0000 mL | INTRAVENOUS | Status: DC | PRN

## 2013-03-31 MED ORDER — CHLORHEXIDINE GLUCONATE 4 % EX LIQD: 1.0000 "application " | Freq: Once | CUTANEOUS | Status: DC

## 2013-03-31 MED ORDER — FENTANYL CITRATE 0.05 MG/ML IJ SOLN
INTRAMUSCULAR | Status: DC | PRN
  Administered 2013-03-31: 100 ug via INTRAVENOUS

## 2013-03-31 MED ORDER — LIDOCAINE-EPINEPHRINE (PF) 1 %-1:200000 IJ SOLN
INTRAMUSCULAR | Status: DC | PRN
  Administered 2013-03-31: 14 mL

## 2013-03-31 MED ORDER — DEXAMETHASONE SODIUM PHOSPHATE 4 MG/ML IJ SOLN
INTRAMUSCULAR | Status: DC | PRN
  Administered 2013-03-31: 4 mg via INTRAVENOUS

## 2013-03-31 MED ORDER — SODIUM CHLORIDE 0.9 % IJ SOLN: 3.0000 mL | INTRAMUSCULAR | Status: DC | PRN

## 2013-03-31 MED ORDER — LACTATED RINGERS IV SOLN
INTRAVENOUS | Status: DC | PRN
  Administered 2013-03-31: 14:00:00 via INTRAVENOUS

## 2013-03-31 MED ORDER — MIDAZOLAM HCL 5 MG/5ML IJ SOLN
INTRAMUSCULAR | Status: DC | PRN
  Administered 2013-03-31: 2 mg via INTRAVENOUS

## 2013-03-31 MED ORDER — VANCOMYCIN HCL 10 G IV SOLR: 1500.0000 mg | INTRAVENOUS | Status: DC

## 2013-03-31 MED ORDER — PROMETHAZINE HCL 25 MG/ML IJ SOLN: 6.2500 mg | INTRAMUSCULAR | Status: DC | PRN

## 2013-03-31 MED ORDER — LIDOCAINE HCL (CARDIAC) 20 MG/ML IV SOLN
INTRAVENOUS | Status: DC | PRN
  Administered 2013-03-31: 50 mg via INTRAVENOUS

## 2013-03-31 MED ORDER — PROPOFOL 10 MG/ML IV BOLUS
INTRAVENOUS | Status: DC | PRN
  Administered 2013-03-31: 200 mg via INTRAVENOUS

## 2013-03-31 MED ORDER — LACTATED RINGERS IV SOLN
INTRAVENOUS | Status: DC
  Administered 2013-03-31: 13:00:00 via INTRAVENOUS

## 2013-03-31 SURGICAL SUPPLY — 43 items
ADH SKN CLS APL DERMABOND .7 (GAUZE/BANDAGES/DRESSINGS) ×1
ADH SKN CLS LQ APL DERMABOND (GAUZE/BANDAGES/DRESSINGS) ×1
APPLIER CLIP 9.375 MED OPEN (MISCELLANEOUS)
APR CLP MED 9.3 20 MLT OPN (MISCELLANEOUS)
BINDER BREAST LRG (GAUZE/BANDAGES/DRESSINGS) IMPLANT
BINDER BREAST XLRG (GAUZE/BANDAGES/DRESSINGS) IMPLANT
CANISTER SUCTION 2500CC (MISCELLANEOUS) ×2 IMPLANT
CHLORAPREP W/TINT 26ML (MISCELLANEOUS) ×2 IMPLANT
CLIP APPLIE 9.375 MED OPEN (MISCELLANEOUS) IMPLANT
CLOTH BEACON ORANGE TIMEOUT ST (SAFETY) ×2 IMPLANT
COVER SURGICAL LIGHT HANDLE (MISCELLANEOUS) ×2 IMPLANT
DECANTER SPIKE VIAL GLASS SM (MISCELLANEOUS) ×2 IMPLANT
DERMABOND ADHESIVE PROPEN (GAUZE/BANDAGES/DRESSINGS) ×1
DERMABOND ADVANCED (GAUZE/BANDAGES/DRESSINGS) ×1
DERMABOND ADVANCED .7 DNX12 (GAUZE/BANDAGES/DRESSINGS) ×1 IMPLANT
DERMABOND ADVANCED .7 DNX6 (GAUZE/BANDAGES/DRESSINGS) ×1 IMPLANT
DEVICE DUBIN SPECIMEN MAMMOGRA (MISCELLANEOUS) ×2 IMPLANT
DRAPE LAPAROSCOPIC ABDOMINAL (DRAPES) ×2 IMPLANT
DRAPE UTILITY 15X26 W/TAPE STR (DRAPE) ×4 IMPLANT
ELECT CAUTERY BLADE 6.4 (BLADE) ×2 IMPLANT
ELECT REM PT RETURN 9FT ADLT (ELECTROSURGICAL) ×2
ELECTRODE REM PT RTRN 9FT ADLT (ELECTROSURGICAL) ×1 IMPLANT
GLOVE BIO SURGEON STRL SZ7 (GLOVE) ×2 IMPLANT
GLOVE EUDERMIC 7 POWDERFREE (GLOVE) ×2 IMPLANT
GOWN STRL NON-REIN LRG LVL3 (GOWN DISPOSABLE) ×2 IMPLANT
GOWN STRL REIN XL XLG (GOWN DISPOSABLE) ×2 IMPLANT
KIT BASIN OR (CUSTOM PROCEDURE TRAY) ×2 IMPLANT
KIT MARKER MARGIN INK (KITS) ×2 IMPLANT
KIT ROOM TURNOVER OR (KITS) ×2 IMPLANT
NEEDLE HYPO 25GX1X1/2 BEV (NEEDLE) ×2 IMPLANT
NS IRRIG 1000ML POUR BTL (IV SOLUTION) ×2 IMPLANT
PACK GENERAL/GYN (CUSTOM PROCEDURE TRAY) ×2 IMPLANT
PAD ARMBOARD 7.5X6 YLW CONV (MISCELLANEOUS) ×4 IMPLANT
SPONGE LAP 4X18 X RAY DECT (DISPOSABLE) IMPLANT
STAPLER VISISTAT 35W (STAPLE) ×2 IMPLANT
SUT MNCRL AB 4-0 PS2 18 (SUTURE) ×2 IMPLANT
SUT SILK 2 0 SH (SUTURE) ×2 IMPLANT
SUT VIC AB 3-0 SH 18 (SUTURE) ×2 IMPLANT
SUT VICRYL AB 3 0 TIES (SUTURE) IMPLANT
SYR CONTROL 10ML LL (SYRINGE) ×2 IMPLANT
TOWEL OR 17X24 6PK STRL BLUE (TOWEL DISPOSABLE) ×2 IMPLANT
TOWEL OR 17X26 10 PK STRL BLUE (TOWEL DISPOSABLE) ×2 IMPLANT
WATER STERILE IRR 1000ML POUR (IV SOLUTION) IMPLANT

## 2013-03-31 NOTE — Transfer of Care (Signed)
Immediate Anesthesia Transfer of Care Note  Patient: Erin [name]  Procedure(s) Performed: Procedure(s): BREAST LUMPECTOMY WITH NEEDLE LOCALIZATION (Right)  Patient Location: PACU  Anesthesia Type:General  Level of Consciousness: awake, alert  and oriented  Airway & Oxygen Therapy: Patient Spontanous Breathing  Post-op Assessment: Report given to PACU RN, Post -op Vital signs reviewed and stable and Patient moving all extremities X 4  Post vital signs: Reviewed and stable  Complications: No apparent anesthesia complications

## 2013-03-31 NOTE — Op Note (Signed)
Patient Name:           Erin [name]   Date of Surgery:        03/31/2013  Pre op Diagnosis:     Right breast mass, 7:00 position.  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 Progressive enlargement.  Post op Diagnosis:    same  Procedure:                 Right partial mastectomy with needle localization and margin assessment  Surgeon:                     Angelia Mould. Derrell Lolling, M.D., FACS  Assistant:                      None  Operative Indications:   Erin [name] is a 42 y.o. female. She is referred by Dr. Konrad Saha at the   [facility] for evaluation and surgical management of an enlarging right breast mass at the 7:00 position.  The patient has not had any prior breast problems. 2 years ago  a solid density was noted on her mammograms and they have ben watching it.   It slowly enlarged and in February underwent image guided biopsy which showed pseudoangiomatous stromal hyperplasia and fibrocystic changes. Six-month followup was advised and followup mammogram and ultrasound on 03/02/2013 shows slight enlargement. There is elliptical reasonably well marginated mass in the 7:00 position right breast which has gone from 2.7 x 2.1 x 1.1 cm, up to 2.8 x 2.4 x 1.6 cm. This is palpable. No overlying skin change. Her only symptoms are a little bit of soreness and tenderness.  No nipple discharge.  Family history is negative for breast or ovarian cancer.   Operative Findings:       The wire entered the right breast at the inframammary crease at the 7:00 position and was directed superiorly through the mass. The specimen mammogram showed that the marker clip, wire, and the mass appeared to be completely excised. This area was palpable and appeared to be smooth and well marginated.  Procedure in Detail:          Wire localization was performed at the breast center of [place]. The patient was brought to the operating room at [facility] where general anesthesia was induced. The right breast was  prepped and draped in sterile fashion. Surgical time out was performed. Intravenous antibiotics were given. The imaging studies were reviewed. 1% Xylocaine with epinephrine was used as a local infiltration anesthetic. A radially oriented incision was made in the lower outer quadrant of the right breast, at the 7:00 position. Dissection was carried down into the breast tissue around the palpable mass and around the localizing wire. The specimen was removed and marked in 3 areas to orient the pathologist. The specimen mammogram looked good as described above and the specimen was marked and sent to the pathology lab. Hemostasis was excellent and achieved electrocautery. The wound was irrigated. The deeper breast tissues were closed in multiple layers with 3-0 Vicryl sutures and the skin closed with a running subcuticular suture of 4-0 Monocryl and Dermabond. The patient tolerated the procedure well was taken to recovery room in stable condition. EBL 10 cc. Counts correct. Complications none.     Angelia Mould. Derrell Lolling, M.D., FACS General and Minimally Invasive Surgery Breast and Colorectal Surgery  03/31/2013 2:25 PM

## 2013-03-31 NOTE — Interval H&P Note (Signed)
History and Physical Interval Note:  03/31/2013 1:04 PM  Erin [name]  has presented today for surgery, with the diagnosis of right breast mass   The goals and the various methods of treatment have been discussed with the patient and family. After consideration of risks, benefits and other options for treatment, the patient has consented to  Procedure(s): BREAST LUMPECTOMY WITH NEEDLE LOCALIZATION (Right) as a surgical intervention .  The patient's history has been reviewed, patient examined today, no change in status, stable for surgery.  I have reviewed the patient's chart and labs.  Questions were answered to the patient's satisfaction.     [name]

## 2013-03-31 NOTE — Anesthesia Postprocedure Evaluation (Signed)
  Anesthesia Post-op Note  Patient: Erin [name]  Procedure(s) Performed: Procedure(s): BREAST LUMPECTOMY WITH NEEDLE LOCALIZATION (Right)  Patient Location: PACU  Anesthesia Type:General  Level of Consciousness: awake and alert   Airway and Oxygen Therapy: Patient Spontanous Breathing  Post-op Pain: none  Post-op Assessment: Post-op Vital signs reviewed  Post-op Vital Signs: stable  Complications: No apparent anesthesia complications

## 2013-03-31 NOTE — Anesthesia Preprocedure Evaluation (Signed)
Anesthesia Evaluation  Patient identified by MRN, date of birth, ID band Patient awake    Reviewed: Allergy & Precautions, H&P , NPO status   Airway Mallampati: I      Dental  (+) Teeth Intact   Pulmonary neg pulmonary ROS,  breath sounds clear to auscultation        Cardiovascular negative cardio ROS  Rhythm:Regular Rate:Normal     Neuro/Psych negative neurological ROS     GI/Hepatic Neg liver ROS, hiatal hernia, GERD-  ,  Endo/Other  negative endocrine ROS  Renal/GU negative Renal ROS     Musculoskeletal   Abdominal   Peds  Hematology   Anesthesia Other Findings   Reproductive/Obstetrics                           Anesthesia Physical Anesthesia Plan  ASA: I  Anesthesia Plan: General   Post-op Pain Management:    Induction: Intravenous  Airway Management Planned: LMA  Additional Equipment:   Intra-op Plan:   Post-operative Plan: Extubation in OR  Informed Consent: I have reviewed the patients History and Physical, chart, labs and discussed the procedure including the risks, benefits and alternatives for the proposed anesthesia with the patient or authorized representative who has indicated his/her understanding and acceptance.   Dental advisory given  Plan Discussed with: CRNA and Surgeon  Anesthesia Plan Comments:         Anesthesia Quick Evaluation

## 2013-03-31 NOTE — Anesthesia Procedure Notes (Signed)
Procedure Name: LMA Insertion Date/Time: 03/31/2013 1:32 PM Performed by: Elon Alas Pre-anesthesia Checklist: Patient identified, Timeout performed, Emergency Drugs available, Suction available and Patient being monitored Patient Re-evaluated:Patient Re-evaluated prior to inductionOxygen Delivery Method: Circle system utilized Preoxygenation: Pre-oxygenation with 100% oxygen Intubation Type: Inhalational induction with existing ETT LMA: LMA inserted LMA Size: 4.0 Number of attempts: 2 Placement Confirmation: positive ETCO2 and breath sounds checked- equal and bilateral Tube secured with: Tape Dental Injury: Teeth and Oropharynx as per pre-operative assessment

## 2013-03-31 NOTE — Preoperative (Signed)
Beta Blockers   Reason not to administer Beta Blockers:Not Applicable 

## 2013-04-01 ENCOUNTER — Encounter (HOSPITAL_COMMUNITY): Payer: Self-pay | Admitting: General Surgery

## 2013-04-03 NOTE — Progress Notes (Signed)
Quick Note:  Inform patient of Pathology report,.Completely benign. Will discuss in detail at next OV.  hmi  ______

## 2013-04-05 ENCOUNTER — Telehealth (INDEPENDENT_AMBULATORY_CARE_PROVIDER_SITE_OTHER): Payer: Self-pay

## 2013-04-05 NOTE — Telephone Encounter (Signed)
Message copied by Ivory Broad on Mon Apr 05, 2013 10:46 AM ------      Message from: [name]      Created: Sat Apr 03, 2013 11:09 PM       Inform patient of Pathology report,.Completely benign. Will discuss in detail at next OV.            hmi       ------

## 2013-04-05 NOTE — Telephone Encounter (Signed)
I called and notified the pt of her pathology results.  She has a follow up appointment already.

## 2013-04-15 ENCOUNTER — Ambulatory Visit (INDEPENDENT_AMBULATORY_CARE_PROVIDER_SITE_OTHER): Payer: BC Managed Care – PPO | Admitting: General Surgery

## 2013-04-15 ENCOUNTER — Encounter (INDEPENDENT_AMBULATORY_CARE_PROVIDER_SITE_OTHER): Payer: Self-pay | Admitting: General Surgery

## 2013-04-15 VITALS — BP 122/76 | HR 72 | Temp 98.0°F | Resp 14 | Ht 61.5 in | Wt 141.2 lb

## 2013-04-15 DIAGNOSIS — N6310 Unspecified lump in the right breast, unspecified quadrant: Secondary | ICD-10-CM

## 2013-04-15 DIAGNOSIS — N63 Unspecified lump in unspecified breast: Secondary | ICD-10-CM

## 2013-04-15 NOTE — Progress Notes (Signed)
Patient ID: ICIS BUDREAU, female   DOB: 1970/11/11, 42 y.o.   MRN: [id number] History: This patient underwent right partial mastectomy with needle localization on 03/31/2013. She has no problems with healing. 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 I've discussed this with her and given her a copy of the pathology report  Date: Right breast is soft. Incision healing normally. Excellent cosmetic result in terms of contour and nipple projection. No hematoma or infection.  Assessment: Pseudo-angiomatous stromal hyperplasia right breast. Recovering uneventfully following right partial mastectomy  Plan: Annual mammography and annual physician breast exam Return to see me if further problems arise.    Angelia Mould. Derrell Lolling, M.D., [facility] General and Minimally invasive Surgery Breast and Colorectal Surgery Office:   [phone] Pager:   [phone]

## 2013-04-15 NOTE — Patient Instructions (Signed)
Your right breast incision is healing normally. We have discussed the expectations of wound healing.  Your pathology shows a benign condition called pseudoangiomatous stromal hyperplasia. This is a benign proliferative disorder that only very slightly increases  risk of breast cancer.  I recommend annual physician breast exam and annual mammogram essentially forever.  Return to see Dr. Derrell Lolling if further problems arise.

## 2013-05-13 ENCOUNTER — Other Ambulatory Visit: Payer: Self-pay

## 2014-05-09 ENCOUNTER — Encounter (INDEPENDENT_AMBULATORY_CARE_PROVIDER_SITE_OTHER): Payer: Self-pay | Admitting: General Surgery

## 2014-11-02 ENCOUNTER — Other Ambulatory Visit: Payer: Self-pay

## 2014-11-02 DIAGNOSIS — Z1231 Encounter for screening mammogram for malignant neoplasm of breast: Secondary | ICD-10-CM

## 2014-11-09 ENCOUNTER — Ambulatory Visit
Admission: RE | Admit: 2014-11-09 | Discharge: 2014-11-09 | Disposition: A | Discharge status: Home / Self Care | Payer: 59 | Source: Ambulatory Visit

## 2014-11-09 DIAGNOSIS — Z1231 Encounter for screening mammogram for malignant neoplasm of breast: Secondary | ICD-10-CM

## 2015-09-25 ENCOUNTER — Other Ambulatory Visit: Payer: Self-pay

## 2015-09-25 DIAGNOSIS — Z1231 Encounter for screening mammogram for malignant neoplasm of breast: Secondary | ICD-10-CM

## 2015-11-10 ENCOUNTER — Ambulatory Visit
Admission: RE | Admit: 2015-11-10 | Discharge: 2015-11-10 | Disposition: A | Discharge status: Home / Self Care | Payer: 59 | Source: Ambulatory Visit

## 2015-11-10 DIAGNOSIS — Z1231 Encounter for screening mammogram for malignant neoplasm of breast: Secondary | ICD-10-CM

## 2016-10-10 ENCOUNTER — Encounter: Payer: Self-pay | Admitting: Gynecology

## 2016-10-10 ENCOUNTER — Ambulatory Visit (INDEPENDENT_AMBULATORY_CARE_PROVIDER_SITE_OTHER): Payer: 59 | Admitting: Gynecology

## 2016-10-10 VITALS — BP 118/76 | Ht 61.0 in | Wt 152.0 lb

## 2016-10-10 DIAGNOSIS — Z1151 Encounter for screening for human papillomavirus (HPV): Secondary | ICD-10-CM

## 2016-10-10 DIAGNOSIS — Z13220 Encounter for screening for lipoid disorders: Secondary | ICD-10-CM

## 2016-10-10 DIAGNOSIS — N943 Premenstrual tension syndrome: Secondary | ICD-10-CM

## 2016-10-10 DIAGNOSIS — Z01419 Encounter for gynecological examination (general) (routine) without abnormal findings: Secondary | ICD-10-CM

## 2016-10-10 LAB — URINALYSIS W MICROSCOPIC + REFLEX CULTURE
Bacteria, UA: NONE SEEN [HPF]
Bilirubin Urine: NEGATIVE
Casts: NONE SEEN [LPF]
Crystals: NONE SEEN [HPF]
Glucose, UA: NEGATIVE
Hgb urine dipstick: NEGATIVE
Ketones, ur: NEGATIVE
LEUKOCYTES UA: NEGATIVE
Nitrite: NEGATIVE
PROTEIN: NEGATIVE
RBC / HPF: NONE SEEN RBC/HPF (ref <=2)
Specific Gravity, Urine: 1.008 (ref 1.001–1.035)
Squamous Epithelial / LPF: NONE SEEN [HPF] (ref <=5)
WBC, UA: NONE SEEN WBC/HPF (ref <=5)
YEAST: NONE SEEN [HPF]
pH: 7 (ref 5.0–8.0)

## 2016-10-10 LAB — CBC WITH DIFFERENTIAL/PLATELET
BASOS ABS: 80 {cells}/uL (ref 0–200)
Basophils Relative: 1 %
Eosinophils Absolute: 80 cells/uL (ref 15–500)
Eosinophils Relative: 1 %
HCT: 40.5 % (ref 35.0–45.0)
Hemoglobin: 13.4 g/dL (ref 11.7–15.5)
Lymphocytes Relative: 25 %
Lymphs Abs: 2000 cells/uL (ref 850–3900)
MCH: 28.7 pg (ref 27.0–33.0)
MCHC: 33.1 g/dL (ref 32.0–36.0)
MCV: 86.7 fL (ref 80.0–100.0)
MONOS PCT: 6 %
MPV: 10.6 fL (ref 7.5–12.5)
Monocytes Absolute: 480 cells/uL (ref 200–950)
NEUTROS PCT: 67 %
Neutro Abs: 5360 cells/uL (ref 1500–7800)
PLATELETS: 314 10*3/uL (ref 140–400)
RBC: 4.67 MIL/uL (ref 3.80–5.10)
RDW: 13.5 % (ref 11.0–15.0)
WBC: 8 10*3/uL (ref 3.8–10.8)

## 2016-10-10 LAB — COMPREHENSIVE METABOLIC PANEL
ALT: 13 U/L (ref 6–29)
AST: 16 U/L (ref 10–35)
Albumin: 3.9 g/dL (ref 3.6–5.1)
Alkaline Phosphatase: 52 U/L (ref 33–115)
BUN: 11 mg/dL (ref 7–25)
CO2: 28 mmol/L (ref 20–31)
CREATININE: 0.71 mg/dL (ref 0.50–1.10)
Calcium: 8.7 mg/dL (ref 8.6–10.2)
Chloride: 104 mmol/L (ref 98–110)
Glucose, Bld: 86 mg/dL (ref 65–99)
Potassium: 4.2 mmol/L (ref 3.5–5.3)
SODIUM: 137 mmol/L (ref 135–146)
Total Bilirubin: 0.3 mg/dL (ref 0.2–1.2)
Total Protein: 6.7 g/dL (ref 6.1–8.1)

## 2016-10-10 LAB — LIPID PANEL
Cholesterol: 186 mg/dL (ref <200)
HDL: 51 mg/dL (ref >50)
LDL CALC: 98 mg/dL (ref <100)
Total CHOL/HDL Ratio: 3.6 Ratio (ref <5.0)
Triglycerides: 186 mg/dL — ABNORMAL HIGH (ref <150)
VLDL: 37 mg/dL — AB (ref <30)

## 2016-10-10 MED ORDER — ALPRAZOLAM 0.5 MG PO TABS: 0.2500 mg | ORAL_TABLET | Freq: Every day | ORAL | 2 refills | Status: DC | PRN

## 2016-10-10 NOTE — Patient Instructions (Signed)

## 2016-10-10 NOTE — Progress Notes (Signed)
    Erin [name] 05/28/1971 [id number]        46 y.o.  G3P3 for annual exam.  Has not been in the office for over 3 years.  Past medical history,surgical history, problem list, medications, allergies, family history and social history were all reviewed and documented as reviewed in the EPIC chart.  ROS:  Performed with pertinent positives and negatives included in the history, assessment and plan.   Additional significant findings :  None   Exam: Caryn Bee assistant Vitals:   10/10/16 0919  BP: 118/76  Weight: 152 lb (68.9 kg)  Height: 5\' 1"  (1.549 m)   Body mass index is 28.72 kg/m.  General appearance:  Normal affect, orientation and appearance. Skin: Grossly normal HEENT: Without gross lesions.  No cervical or supraclavicular adenopathy. Thyroid normal.  Lungs:  Clear without wheezing, rales or rhonchi Cardiac: RR, without RMG Abdominal:  Soft, nontender, without masses, guarding, rebound, organomegaly or hernia Breasts:  Examined lying and sitting without masses, retractions, discharge or axillary adenopathy. Pelvic:  Ext, BUS, Vagina: Normal  Cervix: Normal  Uterus: Anteverted, normal size, shape and contour, midline and mobile nontender   Adnexa: Without masses or tenderness    Anus and perineum: Normal   Rectovaginal: Normal sphincter tone without palpated masses or tenderness.    Assessment/Plan:  46 y.o. G3P3 female for annual exam with regular menses, tubal sterilization.   1. PMS. 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 Usually starts a day or 2 before her menses. Has used Xanax 0.5 mg as needed which has worked well in the past and she requests a refill. #30 with 2 refills provided. 2. Mammography 11/2015. Continue with annual mammography when due. SBE monthly reviewed. 3. Pap smear/HPV 2013. Pap smear/HPV done today. No history of significant abnormal Pap smears previously. 4. Health maintenance. Baseline CBC, CMP, lipid profile,  urinalysis ordered. Follow up in one year, sooner as needed.   Anastasio Auerbach MD, 9:40 AM 10/10/2016

## 2016-10-10 NOTE — Addendum Note (Signed)
Addended by: Nelva Nay on: 10/10/2016 10:07 AM   Modules accepted: Orders

## 2016-10-14 ENCOUNTER — Other Ambulatory Visit: Payer: Self-pay | Admitting: Gynecology

## 2016-10-14 DIAGNOSIS — Z1231 Encounter for screening mammogram for malignant neoplasm of breast: Secondary | ICD-10-CM

## 2016-10-14 LAB — PAP IG AND HPV HIGH-RISK: HPV DNA HIGH RISK: NOT DETECTED

## 2016-11-11 ENCOUNTER — Ambulatory Visit
Admission: RE | Admit: 2016-11-11 | Discharge: 2016-11-11 | Disposition: A | Discharge status: Home / Self Care | Payer: 59 | Source: Ambulatory Visit | Attending: Gynecology | Admitting: Gynecology

## 2016-11-11 DIAGNOSIS — Z1231 Encounter for screening mammogram for malignant neoplasm of breast: Secondary | ICD-10-CM

## 2017-04-11 ENCOUNTER — Other Ambulatory Visit: Payer: Self-pay | Admitting: Gynecology

## 2017-04-11 NOTE — Telephone Encounter (Signed)
Okay for #30 with 2 refills

## 2017-10-14 ENCOUNTER — Encounter: Payer: 59 | Admitting: Gynecology

## 2017-11-25 ENCOUNTER — Other Ambulatory Visit: Payer: Self-pay | Admitting: Gynecology

## 2017-11-25 DIAGNOSIS — Z1231 Encounter for screening mammogram for malignant neoplasm of breast: Secondary | ICD-10-CM

## 2017-11-27 ENCOUNTER — Ambulatory Visit
Admission: RE | Admit: 2017-11-27 | Discharge: 2017-11-27 | Disposition: A | Discharge status: Home / Self Care | Payer: 59 | Source: Ambulatory Visit | Attending: Gynecology | Admitting: Gynecology

## 2017-11-27 DIAGNOSIS — Z1231 Encounter for screening mammogram for malignant neoplasm of breast: Secondary | ICD-10-CM

## 2017-12-05 ENCOUNTER — Encounter: Payer: 59 | Admitting: Gynecology

## 2018-01-23 ENCOUNTER — Ambulatory Visit (INDEPENDENT_AMBULATORY_CARE_PROVIDER_SITE_OTHER): Payer: 59

## 2018-01-23 ENCOUNTER — Ambulatory Visit (INDEPENDENT_AMBULATORY_CARE_PROVIDER_SITE_OTHER): Payer: 59 | Admitting: Podiatry

## 2018-01-23 ENCOUNTER — Encounter: Payer: Self-pay | Admitting: Podiatry

## 2018-01-23 VITALS — BP 109/54 | HR 78 | Resp 16

## 2018-01-23 DIAGNOSIS — M21962 Unspecified acquired deformity of left lower leg: Secondary | ICD-10-CM | POA: Diagnosis not present

## 2018-01-23 DIAGNOSIS — M205X2 Other deformities of toe(s) (acquired), left foot: Secondary | ICD-10-CM | POA: Diagnosis not present

## 2018-01-23 DIAGNOSIS — M205X1 Other deformities of toe(s) (acquired), right foot: Secondary | ICD-10-CM

## 2018-01-23 DIAGNOSIS — M2011 Hallux valgus (acquired), right foot: Secondary | ICD-10-CM

## 2018-01-23 DIAGNOSIS — M779 Enthesopathy, unspecified: Secondary | ICD-10-CM | POA: Diagnosis not present

## 2018-01-23 DIAGNOSIS — M2010 Hallux valgus (acquired), unspecified foot: Secondary | ICD-10-CM

## 2018-01-23 DIAGNOSIS — M2012 Hallux valgus (acquired), left foot: Secondary | ICD-10-CM | POA: Diagnosis not present

## 2018-01-23 NOTE — Patient Instructions (Signed)

## 2018-01-25 NOTE — Progress Notes (Signed)
  Subjective:  Patient ID: Erin [name], female    DOB: 09-Jul-1970,  MRN: [id number]  Chief Complaint  Patient presents with  . Foot Pain    1st MPJ bilateral (L>R) - aching, sharp, cramping sensations x years, recently worsened, callused areas medial/sub 1st MPJ, tried orthotics (OTC) and new shoes-no help  . New Patient (Initial Visit)   47 y.o. female presents with the above complaint.  Reports pain to both big toe joints.  Pain aching sharp cramping sensations for many years.  Recently worsened.  Also complains of callused areas on the bottom side of the great toe joints.  Has tried over-the-counter orthotics tried new shoes without relief.  Past Medical History:  Diagnosis Date  . Complication of anesthesia    epidurals do not work.  . 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 GERD (gastroesophageal reflux disease)    Prilocsec OTC if needed  . H/O hiatal hernia    Past Surgical History:  Procedure Laterality Date  . BREAST BIOPSY Right 2014   Benign [place] Core   . BREAST LUMPECTOMY Right 2014   Benign   . BREAST LUMPECTOMY WITH NEEDLE LOCALIZATION Right 03/31/2013   Procedure: BREAST LUMPECTOMY WITH NEEDLE LOCALIZATION;  Surgeon: Adin Hector, MD;  Location: [facility];  Service: General;  Laterality: Right;  . EYE SURGERY  2001   [place]  . MANDIBLE SURGERY    . TUBAL LIGATION  2005   postpartum   No current outpatient medications on file.  Allergies  Allergen Reactions  . Penicillins Rash   Review of Systems: Negative except as noted in the HPI. Denies N/V/F/Ch. Objective:   Vitals:   01/23/18 0942  BP: (!) 109/54  Pulse: 78  Resp: 16   General AA&O x3. Normal mood and affect.  Vascular Dorsalis pedis and posterior tibial pulses  present 2+ bilaterally  Capillary refill normal to all digits. Pedal hair growth normal.  Neurologic Epicritic sensation grossly present.  Dermatologic No open lesions. Interspaces clear  of maceration. Nails well groomed and normal in appearance. H PK's medial first MPJ bilateral  Orthopedic: MMT 5/5 in dorsiflexion, plantarflexion, inversion, and eversion. Pain and range of motion and palpation left first MPJ.  Slight joint instability present.   Assessment & Plan:  Patient was evaluated and treated and all questions answered.  Hallux limitus bilateral -X-rays taken reviewed.  No acute fractures dislocations.  Abnormal metatarsal parabola with elongated second third metatarsal shortening first metatarsal. -Discussed conservative versus surgical care of patient.  We will proceed with conservative therapy today. -Injection delivered left first MPJ as below  Procedure: Joint Injection Location: Left 1st MPJ joint Skin Prep: Alcohol. Injectate: 0.5 cc 1% lidocaine plain, 0.5 cc dexamethasone phosphate. Disposition: Patient tolerated procedure well. Injection site dressed with a band-aid.   Return in about 3 weeks (around 02/13/2018) for Capsulitis.

## 2018-02-13 ENCOUNTER — Ambulatory Visit: Payer: 59 | Admitting: Podiatry

## 2018-07-27 ENCOUNTER — Ambulatory Visit: Payer: Self-pay

## 2018-07-27 ENCOUNTER — Encounter (HOSPITAL_COMMUNITY): Payer: Self-pay | Admitting: Emergency Medicine

## 2018-07-27 ENCOUNTER — Ambulatory Visit (HOSPITAL_COMMUNITY)
Admission: EM | Admit: 2018-07-27 | Discharge: 2018-07-27 | Disposition: A | Discharge status: Home / Self Care | Payer: 59 | Attending: Family Medicine | Admitting: Family Medicine

## 2018-07-27 DIAGNOSIS — K644 Residual hemorrhoidal skin tags: Secondary | ICD-10-CM

## 2018-07-27 MED ORDER — HYDROCORTISONE ACE-PRAMOXINE 1-1 % RE FOAM: 1.0000 | Freq: Two times a day (BID) | RECTAL | 0 refills | Status: DC

## 2018-07-27 NOTE — ED Triage Notes (Signed)
Pt sts some diarrhea and now having trouble with hemorrhoids with some bleeding

## 2018-07-27 NOTE — ED Provider Notes (Signed)
[place]   [id number] 07/27/18 Arrival Time: 1107  ASSESSMENT & PLAN:  1. External hemorrhoid    Present for over 72 hours. After discussion we have decided against thrombectomy. Warm/hot baths. Ibuprofen. Plans to f/u if not seeing improvement over the next 48 hours.  Meds ordered this encounter  Medications  . hydrocortisone-pramoxine (PROCTOFOAM HC) rectal foam    Sig: Place 1 applicator rectally 2 (two) times daily.    Dispense:  10 g    Refill:  0   Work note given. Reviewed expectations re: course of current medical issues. Questions answered. Outlined signs and symptoms indicating need for more acute intervention. Patient verbalized understanding. After Visit Summary given.   SUBJECTIVE: History from: patient. Erin [name] is a 48 y.o. female who presents with complaint of persistent rectal pain. H/O hemorrhoids "but never this painful." Present > 72 hours. Occasional bright red blood in stool. No abdominal discomfort. Discomfort described as aching with occasional stabbing pains. Pain has stabilized. Fever: absent. Aggravating factors: prolonged sitting. Alleviating factors: have not been identified.She denies diarrhea, nausea, sweats and vomiting. Does have to strain with bowel movements at times. Appetite: normal. PO intake: normal. Ambulatory without assistance. Urinary symptoms: none.  OTC treatment: none.  Past Surgical History:  Procedure Laterality Date  . BREAST BIOPSY Right 2014   Benign [place] Core   . BREAST LUMPECTOMY Right 2014   Benign   . BREAST LUMPECTOMY WITH NEEDLE LOCALIZATION Right 03/31/2013   Procedure: BREAST LUMPECTOMY WITH NEEDLE LOCALIZATION;  Surgeon: Adin Hector, MD;  Location: [facility];  Service: General;  Laterality: Right;  . EYE SURGERY  2001   [place]  . MANDIBLE SURGERY    . TUBAL LIGATION  2005   postpartum   ROS: As per HPI. All other systems negative.  OBJECTIVE:  Vitals:   07/27/18 1213  BP: (!) 154/96  Pulse: 96   Resp: 18  Temp: 98.2 F (36.8 C)  TempSrc: Temporal  SpO2: 100%    General appearance: alert, oriented, no acute distress Lungs: clear to auscultation bilaterally; unlabored respirations Heart: regular rate and rhythm Abdomen: soft; without distention; no tenderness; normal bowel sounds; without masses or organomegaly; without guarding or rebound tenderness Rectal: thrombosed hemorrhoid; approx 1 cm; no active bleeding; pain with palpation; no areas of fluctuance or infection appreciated Back: without CVA tenderness; FROM at waist Extremities: without LE edema; symmetrical; without gross deformities Skin: warm and dry Neurologic: normal gait Psychological: alert and cooperative; normal mood and affect   Allergies  Allergen Reactions  . Penicillins Rash                                               Past Medical History:  Diagnosis Date  . Complication of anesthesia    epidurals do not work.  . 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 GERD (gastroesophageal reflux disease)    Prilocsec OTC if needed  . H/O hiatal hernia    Social History   Socioeconomic History  . Marital status: Married    Spouse name: Not on file  . Number of children: Not on file  . Years of education: Not on file  . Highest education level: Not on file  Occupational History  . Not on file  Social Needs  . Financial resource strain: Not  on file  . Food insecurity:    Worry: Not on file    Inability: Not on file  . Transportation needs:    Medical: Not on file    Non-medical: Not on file  Tobacco Use  . Smoking status: Former Smoker    Last attempt to quit: 07/09/2007    Years since quitting: 11.0  . Smokeless tobacco: Never Used  . Tobacco comment: quit 2008  Substance and Sexual Activity  . Alcohol use: Yes    Comment: very rarely  . Drug use: No  . Sexual activity: Yes    Birth control/protection: Surgical    Comment: 1st  intercourse16 yo-Fewer than 5 partners  Lifestyle  . Physical activity:    Days per week: Not on file    Minutes per session: Not on file  . Stress: Not on file  Relationships  . Social connections:    Talks on phone: Not on file    Gets together: Not on file    Attends religious service: Not on file    Active member of club or organization: Not on file    Attends meetings of clubs or organizations: Not on file    Relationship status: Not on file  . Intimate partner violence:    Fear of current or ex partner: Not on file    Emotionally abused: Not on file    Physically abused: Not on file    Forced sexual activity: Not on file  Other Topics Concern  . Not on file  Social History Narrative  . Not on file   Family History  Problem Relation Age of Onset  . Hypertension Mother   . Hyperlipidemia Mother   . Diabetes Mother   . Hyperlipidemia Maternal Grandmother   . Hypertension Maternal Grandmother   . Diabetes Maternal Grandmother   . Hypertension Maternal Grandfather   . Hyperlipidemia Maternal Grandfather   . Diabetes Maternal Grandfather   . Breast cancer Neg Hx      Vanessa Kick, MD 07/30/18 [phone]

## 2018-07-27 NOTE — Telephone Encounter (Signed)
Pt. Calling for appointment for today as a new pt. Having a hemorrhoid "flare-up." No availability today. Pt. Will go to UC for care and evaluation.

## 2018-07-30 ENCOUNTER — Ambulatory Visit (HOSPITAL_COMMUNITY)
Admission: EM | Admit: 2018-07-30 | Discharge: 2018-07-30 | Disposition: A | Discharge status: Home / Self Care | Payer: 59 | Attending: Family Medicine | Admitting: Family Medicine

## 2018-07-30 ENCOUNTER — Encounter (HOSPITAL_COMMUNITY): Payer: Self-pay

## 2018-07-30 DIAGNOSIS — K644 Residual hemorrhoidal skin tags: Secondary | ICD-10-CM

## 2018-07-30 NOTE — ED Provider Notes (Signed)
[place]   [id number] 07/30/18 Arrival Time: 8338  ASSESSMENT & PLAN:  1. External hemorrhoid    Discussed thrombectomy. Prefers to have provider at [facility] evaluate and discuss treatment options in addition to proper follow up. Appointment today at 1:30 (to arrive by 1:00).  Reviewed expectations re: course of current medical issues. Questions answered. Outlined signs and symptoms indicating need for more acute intervention. Patient verbalized understanding. After Visit Summary given.   SUBJECTIVE: History from: patient. Arlys L Gass is a 48 y.o. female who follows up concerning continuing and persistent rectal pain secondary to a thrombosed hemorrhoid. Still bleeding at times. OTC analgesics and Rx proctofoam without pain relief. No n/v. No abdominal pain.  Patient's last menstrual period was 07/13/2018.   Past Surgical History:  Procedure Laterality Date  . BREAST BIOPSY Right 2014   Benign [place] Core   . BREAST LUMPECTOMY Right 2014   Benign   . BREAST LUMPECTOMY WITH NEEDLE LOCALIZATION Right 03/31/2013   Procedure: BREAST LUMPECTOMY WITH NEEDLE LOCALIZATION;  Surgeon: Adin Hector, MD;  Location: [facility];  Service: General;  Laterality: Right;  . EYE SURGERY  2001   [place]  . MANDIBLE SURGERY    . TUBAL LIGATION  2005   postpartum    ROS: As per HPI. All other systems negative.  OBJECTIVE:  Vitals:   07/30/18 1021  BP: (!) 148/69  Pulse: 78  Resp: 18  Temp: 98 F (36.7 C)  TempSrc: Oral  SpO2: 100%    General appearance: alert, oriented, no acute distress Rectal: defer exam today secondary to seeking surgical evaluation Back: without CVA tenderness; FROM at waist Extremities: normal Skin: warm and dry Neurologic: normal gait Psychological: alert and cooperative; normal mood and affect   Allergies  Allergen Reactions  . Penicillins Rash                                               Past Medical History:  Diagnosis  Date  . Complication of anesthesia    epidurals do not work.  . 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 GERD (gastroesophageal reflux disease)    Prilocsec OTC if needed  . H/O hiatal hernia    Social History   Socioeconomic History  . Marital status: Married    Spouse name: Not on file  . Number of children: Not on file  . Years of education: Not on file  . Highest education level: Not on file  Occupational History  . Not on file  Social Needs  . Financial resource strain: Not on file  . Food insecurity:    Worry: Not on file    Inability: Not on file  . Transportation needs:    Medical: Not on file    Non-medical: Not on file  Tobacco Use  . Smoking status: Former Smoker    Last attempt to quit: 07/09/2007    Years since quitting: 11.0  . Smokeless tobacco: Never Used  . Tobacco comment: quit 2008  Substance and Sexual Activity  . Alcohol use: Yes    Comment: very rarely  . Drug use: No  . Sexual activity: Yes    Birth control/protection: Surgical    Comment: 1st intercourse16 yo-Fewer than 5 partners  Lifestyle  . Physical activity:    Days per  week: Not on file    Minutes per session: Not on file  . Stress: Not on file  Relationships  . Social connections:    Talks on phone: Not on file    Gets together: Not on file    Attends religious service: Not on file    Active member of club or organization: Not on file    Attends meetings of clubs or organizations: Not on file    Relationship status: Not on file  . Intimate partner violence:    Fear of current or ex partner: Not on file    Emotionally abused: Not on file    Physically abused: Not on file    Forced sexual activity: Not on file  Other Topics Concern  . Not on file  Social History Narrative  . Not on file   Family History  Problem Relation Age of Onset  . Hypertension Mother   . Hyperlipidemia Mother   . Diabetes Mother   . Hyperlipidemia  Maternal Grandmother   . Hypertension Maternal Grandmother   . Diabetes Maternal Grandmother   . Hypertension Maternal Grandfather   . Hyperlipidemia Maternal Grandfather   . Diabetes Maternal Grandfather   . Breast cancer Neg Hx      Vanessa Kick, MD 07/30/18 1114

## 2018-07-30 NOTE — ED Triage Notes (Signed)
Pt has is following back up on her rectal bleeding (hemorroids). Pt states she still feels pressure and rectal is still bleeding.

## 2018-09-02 DIAGNOSIS — Z9851 Tubal ligation status: Secondary | ICD-10-CM | POA: Insufficient documentation

## 2019-03-02 ENCOUNTER — Other Ambulatory Visit: Payer: Self-pay | Admitting: Nurse Practitioner

## 2019-03-02 DIAGNOSIS — Z1231 Encounter for screening mammogram for malignant neoplasm of breast: Secondary | ICD-10-CM

## 2019-03-31 ENCOUNTER — Encounter: Payer: Self-pay | Admitting: Gynecology

## 2019-04-15 ENCOUNTER — Ambulatory Visit
Admission: RE | Admit: 2019-04-15 | Discharge: 2019-04-15 | Disposition: A | Discharge status: Home / Self Care | Payer: 59 | Source: Ambulatory Visit | Attending: Nurse Practitioner | Admitting: Nurse Practitioner

## 2019-04-15 ENCOUNTER — Other Ambulatory Visit: Payer: Self-pay

## 2019-04-15 DIAGNOSIS — Z1231 Encounter for screening mammogram for malignant neoplasm of breast: Secondary | ICD-10-CM

## 2019-05-14 ENCOUNTER — Other Ambulatory Visit: Payer: Self-pay | Admitting: Podiatry

## 2019-05-14 DIAGNOSIS — M779 Enthesopathy, unspecified: Secondary | ICD-10-CM

## 2019-05-15 ENCOUNTER — Ambulatory Visit (INDEPENDENT_AMBULATORY_CARE_PROVIDER_SITE_OTHER): Payer: 59 | Admitting: Podiatry

## 2019-05-15 ENCOUNTER — Ambulatory Visit: Payer: Self-pay

## 2019-05-15 ENCOUNTER — Other Ambulatory Visit: Payer: Self-pay

## 2019-05-15 DIAGNOSIS — G5761 Lesion of plantar nerve, right lower limb: Secondary | ICD-10-CM

## 2019-05-15 DIAGNOSIS — M2042 Other hammer toe(s) (acquired), left foot: Secondary | ICD-10-CM

## 2019-05-15 DIAGNOSIS — M779 Enthesopathy, unspecified: Secondary | ICD-10-CM

## 2019-05-15 NOTE — Progress Notes (Signed)
  Subjective:  Patient ID: Erin [name], female    DOB: [birth year],  MRN: [id number]  Chief Complaint  Patient presents with  . Foot Pain    Rt arch pain radiates to top of foot x 1 yr comes and goes; 8/10 sharp pains -pain when flexing  foot -worse with walking or running -pt denie swellign/redness Tx: advil -pt also c/o Rt 5th toe crossing over and BL plantar foot lesions x years and are very tender Tx: none     48 y.o. female presents with the above complaint. Hx above confirmed with patient.   Review of Systems: Negative except as noted in the HPI. Denies N/V/F/Ch.  Past Medical History:  Diagnosis Date  . Complication of anesthesia    epidurals do not work.  . 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 GERD (gastroesophageal reflux disease)    Prilocsec OTC if needed  . H/O hiatal hernia    No current outpatient medications on file.  Social History   Tobacco Use  Smoking Status Former Smoker  . Quit date: 07/09/2007  . Years since quitting: 11.8  Smokeless Tobacco Never Used  Tobacco Comment   quit 2008    Allergies  Allergen Reactions  . Penicillins Rash   Objective:  There were no vitals filed for this visit. There is no height or weight on file to calculate BMI. Constitutional Well developed. Well nourished.  Vascular Dorsalis pedis pulses palpable bilaterally. Posterior tibial pulses palpable bilaterally. Capillary refill normal to all digits.  No cyanosis or clubbing noted. Pedal hair growth normal.  Neurologic Normal speech. Oriented to person, place, and time. Epicritic sensation to light touch grossly present bilaterally.  Dermatologic Nails well groomed and normal in appearance. No open wounds. Multiple punctate keratoses Hyperkeratosis left lateral 4th PIPJ  Orthopedic: POP right 2nd/3rd interspace Lesser digital contractures left   Radiographs: Taken and reviewed no acute fractures or  dislocations HAV deformity met primus elevatus Assessment:   1. Morton's neuroma, right   2. Hammer toe of left foot    Plan:  Patient was evaluated and treated and all questions answered.  Interdigital Neuroma, right -Educated on etiology -Interspace injection delivered as below.  Procedure: Neuroma Injection Location: Right 3rd/4th interspace Skin Prep: Alcohol. Injectate: 0.5 cc 0.5% marcaine plain, 0.5 cc dexamethasone phosphate, 0.5 cc kenalog 10 Disposition: Patient tolerated procedure well. Injection site dressed with a band-aid.  Hammertoe left -Discussed possible surgical correction should pain persist.  Return in about 3 weeks (around 06/05/2019) for Neuroma, Right.

## 2019-05-17 ENCOUNTER — Other Ambulatory Visit: Payer: Self-pay | Admitting: Podiatry

## 2019-05-17 DIAGNOSIS — G5761 Lesion of plantar nerve, right lower limb: Secondary | ICD-10-CM

## 2019-06-10 ENCOUNTER — Other Ambulatory Visit: Payer: Self-pay

## 2019-06-10 ENCOUNTER — Ambulatory Visit (INDEPENDENT_AMBULATORY_CARE_PROVIDER_SITE_OTHER): Payer: 59 | Admitting: Podiatry

## 2019-06-10 DIAGNOSIS — G5761 Lesion of plantar nerve, right lower limb: Secondary | ICD-10-CM

## 2019-07-06 ENCOUNTER — Other Ambulatory Visit: Payer: Self-pay

## 2019-07-06 ENCOUNTER — Ambulatory Visit (INDEPENDENT_AMBULATORY_CARE_PROVIDER_SITE_OTHER): Payer: 59 | Admitting: Orthotics

## 2019-07-06 DIAGNOSIS — M7751 Other enthesopathy of right foot: Secondary | ICD-10-CM | POA: Diagnosis not present

## 2019-07-06 DIAGNOSIS — G5761 Lesion of plantar nerve, right lower limb: Secondary | ICD-10-CM

## 2019-07-06 DIAGNOSIS — M779 Enthesopathy, unspecified: Secondary | ICD-10-CM

## 2019-07-06 DIAGNOSIS — M2042 Other hammer toe(s) (acquired), left foot: Secondary | ICD-10-CM

## 2019-07-06 DIAGNOSIS — M7752 Other enthesopathy of left foot: Secondary | ICD-10-CM

## 2019-07-06 NOTE — Progress Notes (Signed)
Patient came in today to pick up custom made foot orthotics.  The goals were accomplished and the patient reported no dissatisfaction with said orthotics.  Patient was advised of breakin period and how to report any issues. 

## 2019-07-11 NOTE — Progress Notes (Signed)
  Subjective:  Patient ID: Erin [name], female    DOB: 02/24/1971,  MRN: [id number]  Chief Complaint  Patient presents with  . Foot Pain    pt is here for neuroma of the right foot, pt states that she does not want an injection, and is considering possible surgery as well    49 y.o. female presents with the above complaint. Hx above confirmed with patient.   Review of Systems: Negative except as noted in the HPI. Denies N/V/F/Ch.  Past Medical History:  Diagnosis Date  . Complication of anesthesia    epidurals do not work.  . 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 GERD (gastroesophageal reflux disease)    Prilocsec OTC if needed  . H/O hiatal hernia     Current Outpatient Medications:  .  methylPREDNISolone (MEDROL DOSEPAK) 4 MG TBPK tablet, Follow package directions, Disp: , Rfl:  .  tiZANidine (ZANAFLEX) 4 MG tablet, Take 1/2 to 1 tablet at bedtime as needed for spasm, Disp: , Rfl:   Social History   Tobacco Use  Smoking Status Former Smoker  . Quit date: 07/09/2007  . Years since quitting: 12.0  Smokeless Tobacco Never Used  Tobacco Comment   quit 2008    Allergies  Allergen Reactions  . Penicillins Rash   Objective:  There were no vitals filed for this visit. There is no height or weight on file to calculate BMI. Constitutional Well developed. Well nourished.  Vascular Dorsalis pedis pulses palpable bilaterally. Posterior tibial pulses palpable bilaterally. Capillary refill normal to all digits.  No cyanosis or clubbing noted. Pedal hair growth normal.  Neurologic Normal speech. Oriented to person, place, and time. Epicritic sensation to light touch grossly present bilaterally.  Dermatologic Nails well groomed and normal in appearance. No open wounds. Multiple punctate keratoses Hyperkeratosis left lateral 4th PIPJ  Orthopedic: POP right 2nd/3rd interspace Lesser digital contractures left    Assessment:   1. Morton's neuroma, right    Plan:  Patient was evaluated and treated and all questions answered.  Interdigital Neuroma, right -Would benefit from custom molded orthotics.  Casted for orthotics will follow-up in several weeks for pickup  No follow-ups on file.

## 2019-07-22 ENCOUNTER — Ambulatory Visit: Payer: 59 | Admitting: Podiatry

## 2020-04-10 ENCOUNTER — Other Ambulatory Visit: Payer: Self-pay | Admitting: Nurse Practitioner

## 2020-04-10 DIAGNOSIS — Z1231 Encounter for screening mammogram for malignant neoplasm of breast: Secondary | ICD-10-CM

## 2020-04-24 ENCOUNTER — Ambulatory Visit: Payer: 59

## 2020-04-26 ENCOUNTER — Ambulatory Visit: Payer: 59

## 2020-05-17 ENCOUNTER — Ambulatory Visit
Admission: RE | Admit: 2020-05-17 | Discharge: 2020-05-17 | Disposition: A | Discharge status: Home / Self Care | Payer: 59 | Source: Ambulatory Visit | Attending: Nurse Practitioner | Admitting: Nurse Practitioner

## 2020-05-17 ENCOUNTER — Other Ambulatory Visit: Payer: Self-pay

## 2020-05-17 DIAGNOSIS — Z1231 Encounter for screening mammogram for malignant neoplasm of breast: Secondary | ICD-10-CM

## 2020-06-14 IMAGING — MG MM DIGITAL SCREENING BILAT W/ TOMO W/ CAD
8 series · 8 of 24 positions shown · non-contrast
Comparison: Previous exam(s).

CLINICAL DATA: Screening.

EXAM:
DIGITAL SCREENING BILATERAL MAMMOGRAM WITH TOMO AND CAD

[R MLO synth-2D]
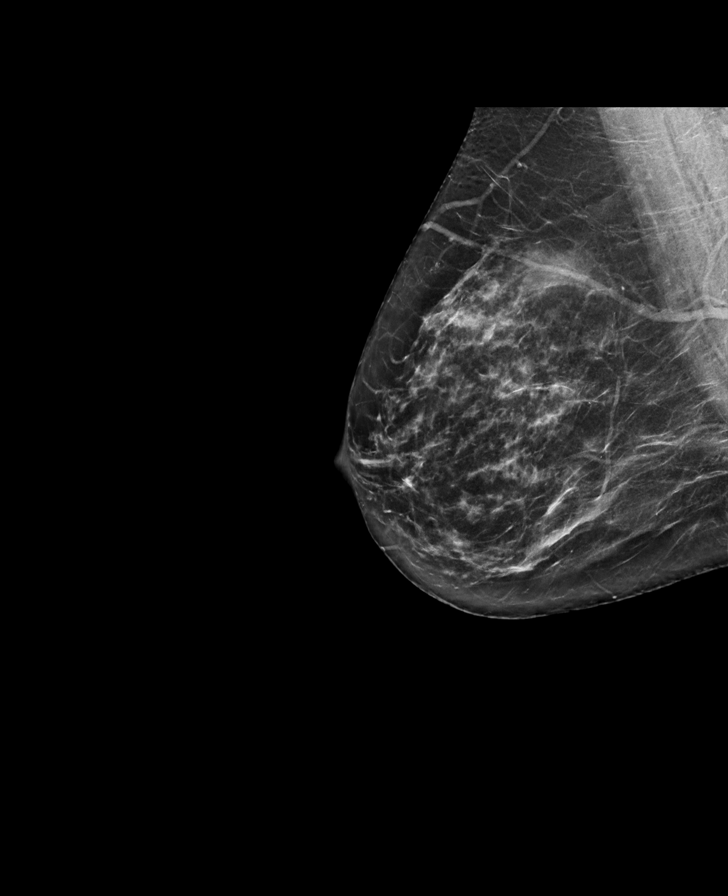

[R CC synth-2D]
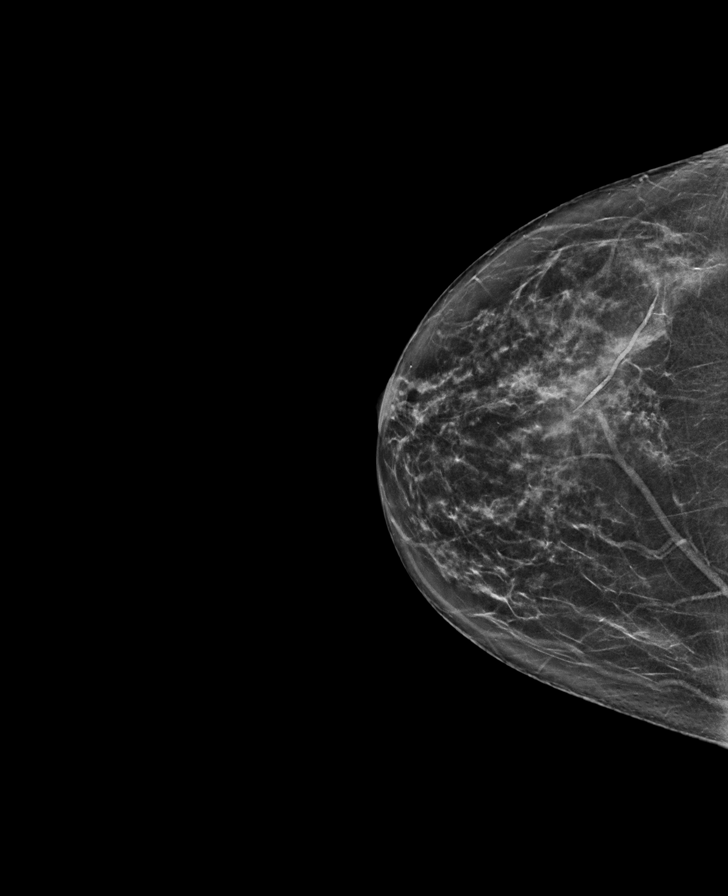

[L CC synth-2D]
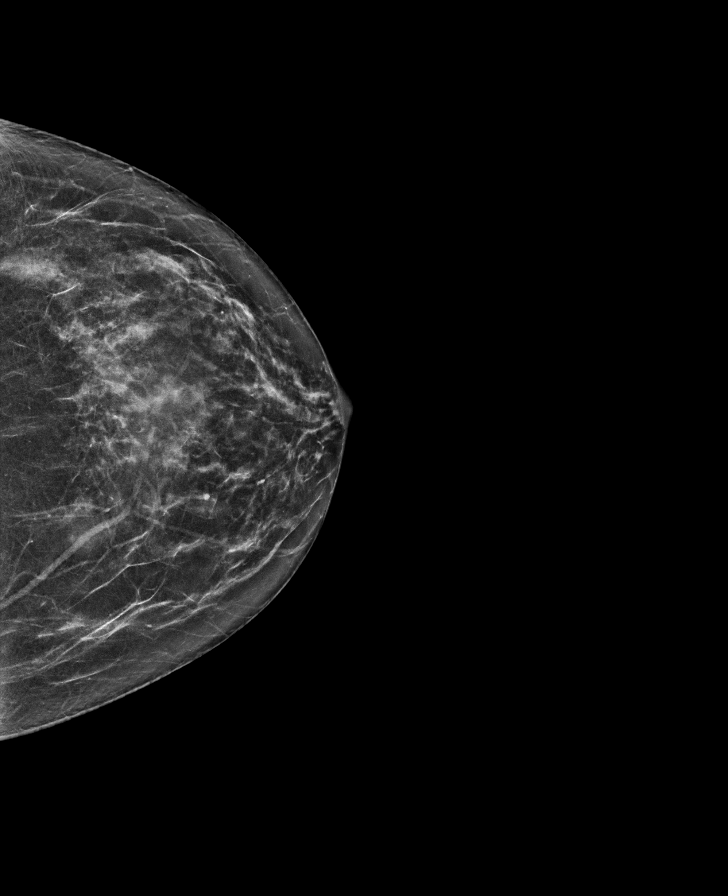

[L MLO synth-2D]
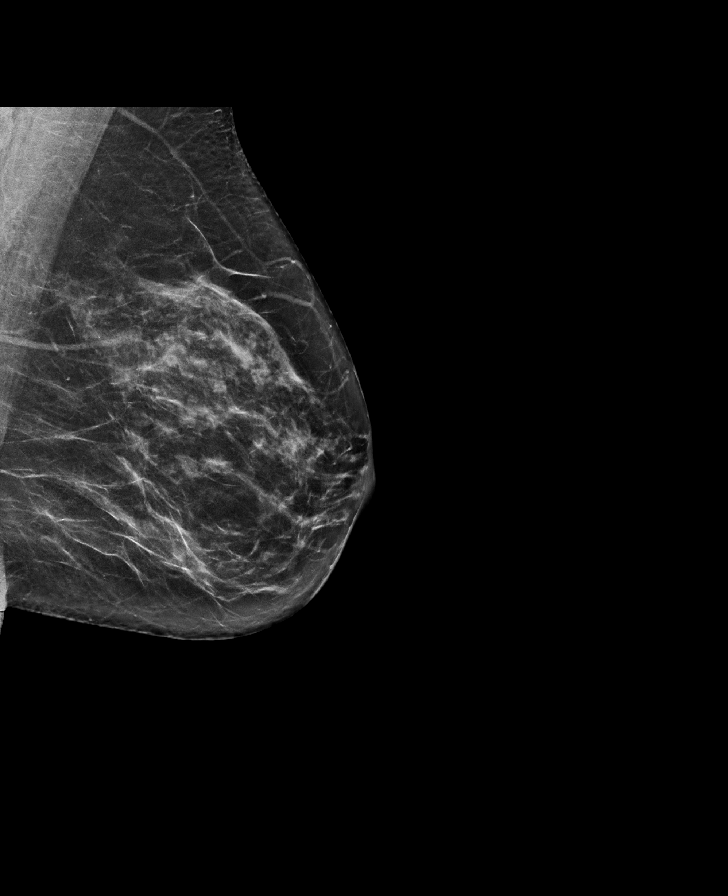

[R CC tomo · tomo slice 31/62.0]
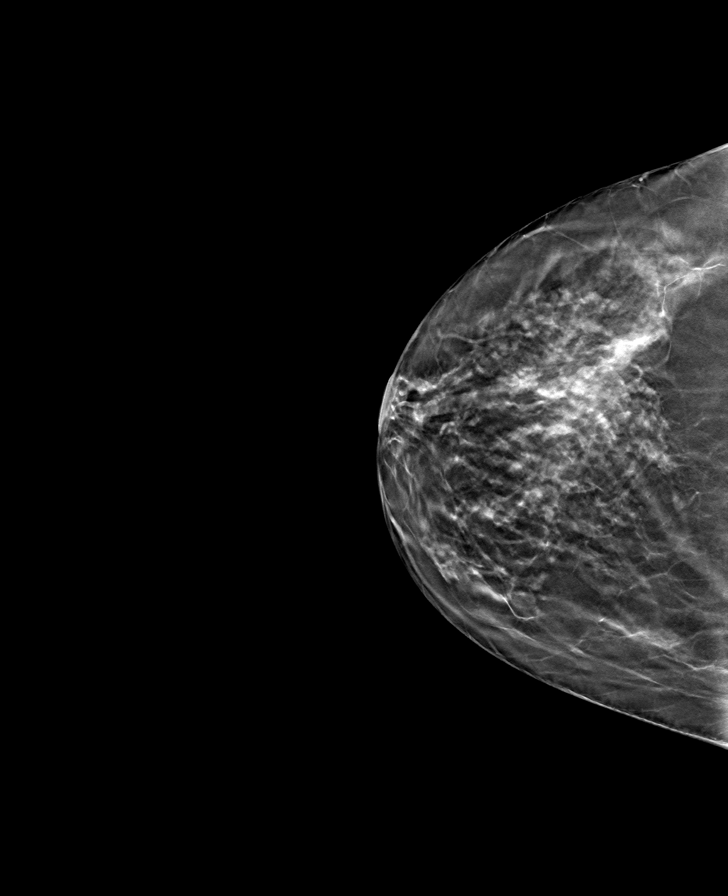

[L CC tomo · tomo slice 31/60.0]
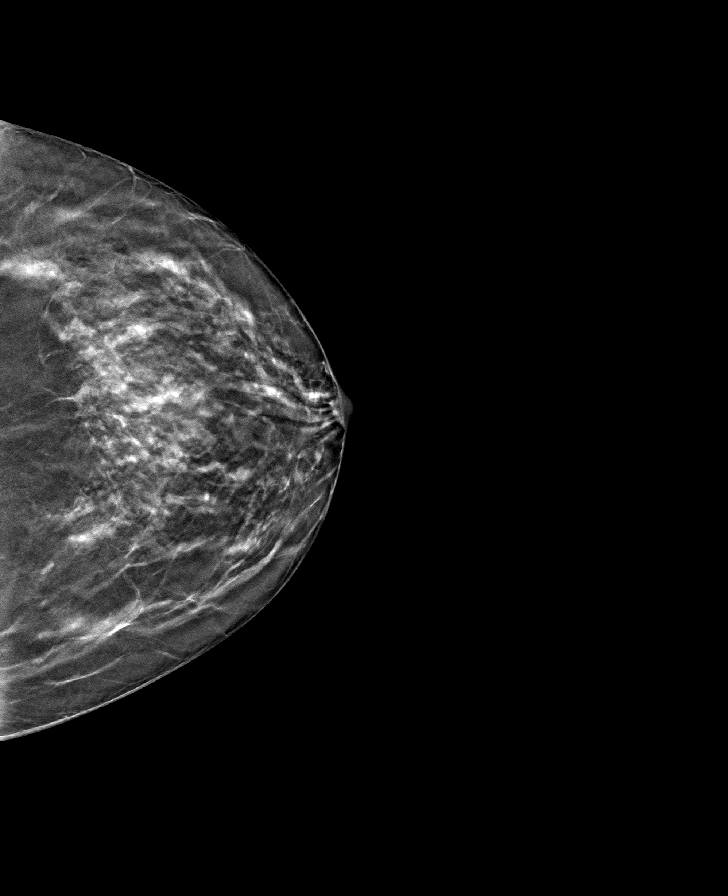

[R MLO tomo · tomo slice 35/69.0]
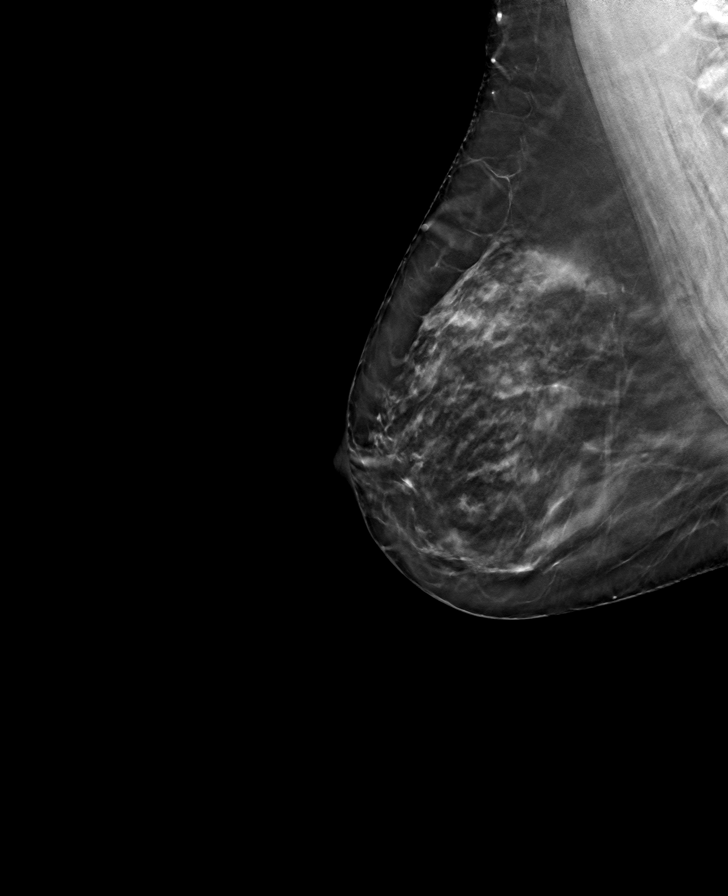

[L MLO tomo · tomo slice 36/71.0]
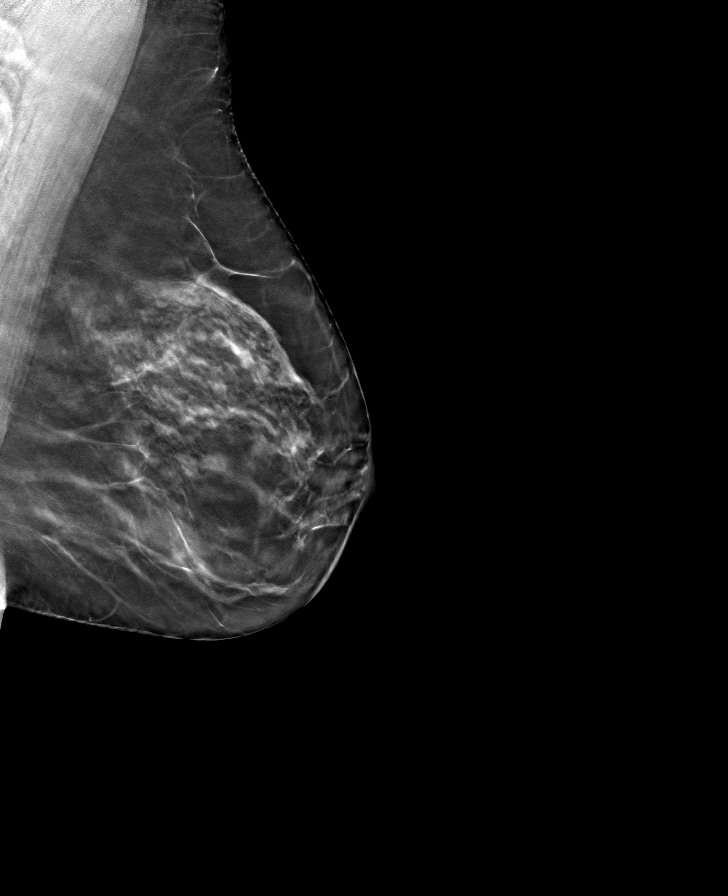

[8 of 24 positions shown; findings below may reference images not displayed]

ACR Breast Density Category b: There are scattered areas of
fibroglandular density.
FINDINGS: There are no findings suspicious for malignancy. Images were
processed with CAD.
IMPRESSION: No mammographic evidence of malignancy. A result letter of this
screening mammogram will be mailed directly to the patient.

RECOMMENDATION:
Screening mammogram in one year. (Code:CN-U-775)

BI-RADS CATEGORY  1: Negative.

## 2021-01-17 ENCOUNTER — Other Ambulatory Visit: Payer: Self-pay

## 2021-01-17 ENCOUNTER — Encounter: Payer: Self-pay | Admitting: Emergency Medicine

## 2021-01-17 ENCOUNTER — Ambulatory Visit
Admission: EM | Admit: 2021-01-17 | Discharge: 2021-01-17 | Disposition: A | Discharge status: Home / Self Care | Payer: 59 | Attending: Internal Medicine | Admitting: Internal Medicine

## 2021-01-17 DIAGNOSIS — M25361 Other instability, right knee: Secondary | ICD-10-CM

## 2021-01-17 NOTE — ED Triage Notes (Signed)
RT knee pain that feels like its catching that started yesterday. Denies any injury.

## 2021-01-17 NOTE — Discharge Instructions (Addendum)
Please use the knee brace to help with knee buckling If your symptoms are persistent after a month you will need orthopedic surgery evaluation and further imaging You can take Tylenol or ibuprofen as needed for pain/or swelling. Return to urgent care if symptoms worsen.

## 2021-01-17 NOTE — ED Provider Notes (Signed)
[facility]    CSN: [id number] Arrival date & time: 01/17/21  1504      History   Chief Complaint Right knee pain of 1 day duration  HPI Erin [name] is a 50 y.o. female comes to urgent care with complaints of right knee pain which started yesterday.  Patient describes the pain as sharp and last for short period of time.  It is associated with some buckling in the right knee and also locking of the right knee.  Patient denies any trauma or falls.  No swelling in the right knee.  No bruising.  Symptoms started yesterday.  No fever or chills.  No known relieving factors.  No known aggravating factors.  Patient also describes a grinding sensation in the patellofemoral area. HPI  Past Medical History:  Diagnosis Date   Complication of anesthesia    epidurals do not work.   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   suspected on mammogram with planned expected management   GERD (gastroesophageal reflux disease)    Prilocsec OTC if needed   H/O hiatal hernia     Patient Active Problem List   Diagnosis Date Noted   Breast mass, right 03/12/2013    Past Surgical History:  Procedure Laterality Date   BREAST BIOPSY Right 2014   Benign [place] Core    BREAST EXCISIONAL BIOPSY Right    BREAST LUMPECTOMY WITH NEEDLE LOCALIZATION Right 03/31/2013   Procedure: BREAST LUMPECTOMY WITH NEEDLE LOCALIZATION;  Surgeon: Adin Hector, MD;  Location: [facility];  Service: General;  Laterality: Right;   EYE SURGERY  2001   [place]  2005   postpartum    OB History     Gravida  3   Para  3   Term      Preterm      AB      Living  3      SAB      IAB      Ectopic      Multiple      Live Births               Home Medications    Prior to Admission medications   Medication Sig Start Date End Date Taking? Authorizing Provider  methylPREDNISolone (MEDROL DOSEPAK) 4 MG TBPK tablet Follow package directions 05/21/19   [provider]  tiZANidine (ZANAFLEX) 4 MG tablet Take 1/2 to 1 tablet at bedtime as needed for spasm 05/21/19   [provider]    Family History Family History  Problem Relation Age of Onset   Hypertension Mother    Hyperlipidemia Mother    Diabetes Mother    Hyperlipidemia Maternal Grandmother    Hypertension Maternal Grandmother    Diabetes Maternal Grandmother    Hypertension Maternal Grandfather    Hyperlipidemia Maternal Grandfather    Diabetes Maternal Grandfather    Breast cancer Neg Hx     Social History Social History   Tobacco Use   Smoking status: Former    Pack years: 0.00    Types: Cigarettes    Quit date: 07/09/2007    Years since quitting: 13.5   Smokeless tobacco: Never   Tobacco comments:    quit 2008  Substance Use Topics   Alcohol use: Yes    Comment: very rarely   Drug use: No     Allergies   Penicillins   Review of Systems Review of Systems  Gastrointestinal: Negative.   Genitourinary: Negative.   Musculoskeletal:  Positive for arthralgias. Negative for joint swelling and myalgias.    Physical Exam Triage Vital Signs ED Triage Vitals  Enc Vitals Group     BP 01/17/21 1552 131/82     Pulse Rate 01/17/21 1552 70     Resp 01/17/21 1552 16     Temp 01/17/21 1552 98.6 F (37 C)     Temp Source 01/17/21 1552 Oral     SpO2 01/17/21 1552 98 %     Weight --      Height --      Head Circumference --      Peak Flow --      Pain Score 01/17/21 1558 0     Pain Loc --      Pain Edu? --      Excl. in [place]? --    No data found.  Updated Vital Signs BP 131/82 (BP Location: Right Arm)   Pulse 70   Temp 98.6 F (37 C) (Oral)   Resp 16   SpO2 98%   Visual Acuity Right Eye Distance:   Left Eye Distance:   Bilateral Distance:    Right Eye Near:   Left Eye Near:    Bilateral Near:     Physical Exam Vitals and nursing note reviewed.  Constitutional:      General: She is not in acute distress.    Appearance: She is not ill-appearing.   Cardiovascular:     Rate and Rhythm: Normal rate and regular rhythm.  Musculoskeletal:        General: No swelling, tenderness, deformity or signs of injury. Normal range of motion.  Neurological:     Mental Status: She is alert.     UC Treatments / Results  Labs (all labs ordered are listed, but only abnormal results are displayed) Labs Reviewed - No data to display  EKG   Radiology No results found.  Procedures Procedures (including critical care time)  Medications Ordered in UC Medications - No data to display  Initial Impression / Assessment and Plan / UC Course  I have reviewed the triage vital signs and the nursing notes.  Pertinent labs & imaging results that were available during my care of the patient were reviewed by me and considered in my medical decision making (see chart for details).     1.  Right knee buckling: NSAIDs as needed for pain Knee sleeve or brace is recommended Knee strengthening exercises is recommended If symptoms are persistent or worsens-orthopedic surgery referral will be appropriate. Imaging will be appropriate at that time. Final Clinical Impressions(s) / UC Diagnoses   Final diagnoses:  Right knee buckling     Discharge Instructions      Please use the knee brace to help with knee buckling If your symptoms are persistent after a month you will need orthopedic surgery evaluation and further imaging You can take Tylenol or ibuprofen as needed for pain/or swelling. Return to urgent care if symptoms worsen.   ED Prescriptions   None    PDMP not reviewed this encounter.   Chase Picket, MD 01/17/21 1919

## 2021-02-16 DIAGNOSIS — M25561 Pain in right knee: Secondary | ICD-10-CM | POA: Insufficient documentation

## 2021-04-03 ENCOUNTER — Other Ambulatory Visit: Payer: Self-pay | Admitting: Nurse Practitioner

## 2021-04-03 DIAGNOSIS — Z1231 Encounter for screening mammogram for malignant neoplasm of breast: Secondary | ICD-10-CM

## 2021-05-18 ENCOUNTER — Ambulatory Visit
Admission: RE | Admit: 2021-05-18 | Discharge: 2021-05-18 | Disposition: A | Discharge status: Home / Self Care | Payer: 59 | Source: Ambulatory Visit | Attending: Nurse Practitioner | Admitting: Nurse Practitioner

## 2021-05-18 DIAGNOSIS — Z1231 Encounter for screening mammogram for malignant neoplasm of breast: Secondary | ICD-10-CM

## 2021-05-23 ENCOUNTER — Encounter: Payer: Self-pay | Admitting: Physical Therapy

## 2021-05-23 ENCOUNTER — Other Ambulatory Visit: Payer: Self-pay

## 2021-05-23 ENCOUNTER — Ambulatory Visit: Payer: 59 | Attending: Neurosurgery | Admitting: Physical Therapy

## 2021-05-23 DIAGNOSIS — R293 Abnormal posture: Secondary | ICD-10-CM | POA: Insufficient documentation

## 2021-05-23 DIAGNOSIS — M542 Cervicalgia: Secondary | ICD-10-CM | POA: Insufficient documentation

## 2021-05-23 NOTE — Therapy (Signed)
[facility] [place], [place], [address] Phone: [phone]   Fax:  [phone]  Physical Therapy Evaluation  Patient Details  Name: Erin [name] MRN: [id number] Date of Birth: 05/22/1971 Referring Provider (PT): Mallie Mussel pool MD   Encounter Date: 05/23/2021   PT End of Session - 05/23/21 1357     Visit Number 1    Number of Visits 12    Date for PT Re-Evaluation 07/04/21    Authorization Type FOTO.    PT Start Time 0104    PT Stop Time 0148    PT Time Calculation (min) 44 min    Activity Tolerance Patient tolerated treatment well    Behavior During Therapy WFL for tasks assessed/performed             Past Medical History:  Diagnosis Date   Complication of anesthesia    epidurals do not work.   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    Subjective Assessment - 05/23/21 1333     Subjective COVID-19 screen performed prior to patient entering clinic.  The patient presents to the clinic with c/o of left sided neck pain that occurred about three weeks ago.  She has had on/off neck pain for a couple of years but her neck had beenfeeling good.  Her pain is rated at a 6/10.  Pain increase with turning her head and look up.  Not moving her head decreases her pain.    Pertinent History Biceps repair (right), mandible  surgery.    How long can you sit comfortably? Essentially in good posture.    Patient Stated Goals Get out of pain.    Currently in Pain? Yes    Pain Score 6     Pain Location Neck    Pain Orientation Left    Pain Descriptors / Indicators Sore    Pain Type Acute pain    Pain Radiating Towards Left shoulder.    Pain Onset 1 to 4 weeks ago    Pain Frequency Constant    Aggravating Factors  Turning head and looking up.    Pain Relieving Factors Noy turning head.                [facility] PT Assessment - 05/23/21 0001       Assessment   Medical Diagnosis Cervical spondylosis    Referring Provider (PT) Mallie Mussel pool MD    Onset Date/Surgical Date --   ~3 weeks ago.     Precautions   Precautions None      Restrictions   Weight Bearing Restrictions No      Balance Screen   Has the patient fallen in the past 6 months Yes    How many times? 2.    Has the patient  had a decrease in activity level because of a fear of falling?  No    Is the patient reluctant to leave their home because of a fear of falling?  No      Home Environment   Living Environment Private residence      Prior Function   Level of Independence Independent      Posture/Postural Control   Posture/Postural Control Postural limitations    Postural Limitations Rounded Shoulders;Forward head      Deep Tendon Reflexes   DTR Assessment Site Biceps;Brachioradialis;Triceps    Biceps DTR 2+    Brachioradialis DTR 2+    Triceps DTR 2+      ROM / Strength   AROM / PROM / Strength AROM;Strength      AROM   Overall AROM Comments Active left cervical rotation is 50 degrees and right is ro degrees, right SBing is 15 degrees and left is 20 degrees.      Strength   Overall Strength Comments Normal UE strength.      Palpation   Palpation comment Tender and taut to palpation over patient's left UT especially mid- muscle belly.  She also has tenderness over her left Levator Scapulae and medial scapular border muscular  tenderness.                        Objective measurements completed on examination: See above findings.       [facility] Adult PT Treatment/Exercise - 05/23/21 0001       Modalities   Modalities Electrical Stimulation;Moist Heat      Moist Heat Therapy   Number Minutes Moist Heat 15 Minutes    Moist Heat Location --   Left UT.     Acupuncturist Location Left UT region.    Electrical Stimulation Action IFC at 80-150 Hz.    Electrical Stimulation Parameters 40% scan x 15 minutes.                     PT Education - 05/23/21 1422     Education Details Chin tuck and extension.    Person(s) Educated Patient    Methods Explanation;Demonstration    Comprehension Verbalized understanding                 PT Long Term Goals - 05/23/21 1429       PT LONG TERM GOAL #1   Title Independent with a HEP.    Time 6    Period Weeks    Status New      PT LONG TERM GOAL #2   Title Increase active cervical rotation to 70 degrees+ so patient can turn head more easily while driving.    Time 6    Period Weeks    Status New      PT LONG TERM GOAL #3   Title Perform ADL's with pain not > 2-3/10    Time 6    Period Weeks    Status New                    Plan - 05/23/21 1425     Clinical Impression Statement The patient presents to OPPT with c/o left sided neck pain that came on about three weeks ago.  She is tender and taut to palpation over her left UT.  She has limited active cervical range of motion.  Her UE strength is normal.  UE DTR's are  normal as well.  Moving her neck and looking up increase her pain.  Patient will benefit from skilled physical therapy intervention to address pain and deficits.    Personal Factors and Comorbidities Comorbidity 1;Other    Comorbidities Biceps repair (right), mandible surgery.    Examination-Activity Limitations Other    Examination-Participation Restrictions Other     Stability/Clinical Decision Making Stable/Uncomplicated    Clinical Decision Making Low    Rehab Potential Excellent    PT Frequency 2x / week    PT Duration 6 weeks    PT Treatment/Interventions ADLs/Self Care Home Management;Cryotherapy;Electrical Stimulation;Ultrasound;Traction;Therapeutic activities;Therapeutic exercise;Manual techniques;Passive range of motion;Dry needling    PT Next Visit Plan Combo e'stim/US, STW/M, postural exercises.    Consulted and Agree with Plan of Care Patient             Patient will benefit from skilled therapeutic intervention in order to improve the following deficits and impairments:  Pain, Decreased activity tolerance, Decreased range of motion, Increased muscle spasms  Visit Diagnosis: Cervicalgia - Plan: PT plan of care cert/re-cert  Abnormal posture - Plan: PT plan of care cert/re-cert     Problem List Patient Active Problem List   Diagnosis Date Noted   Breast mass, right 03/12/2013    Cato Liburd, [place], PT 05/23/2021, 2:31 PM  [facility] [place], [place], [address] Phone: [phone]   Fax:  [phone]  Name: Erin [name] MRN: [id number] Date of Birth: Jun 17, 1971

## 2021-05-29 ENCOUNTER — Other Ambulatory Visit: Payer: Self-pay

## 2021-05-29 ENCOUNTER — Ambulatory Visit: Payer: 59 | Admitting: Physical Therapy

## 2021-05-29 DIAGNOSIS — M542 Cervicalgia: Secondary | ICD-10-CM

## 2021-05-29 DIAGNOSIS — R293 Abnormal posture: Secondary | ICD-10-CM

## 2021-05-29 NOTE — Therapy (Signed)
[facility] [place], [place], [address] Phone: [phone]   Fax:  [phone]  Physical Therapy Treatment  Patient Details  Name: Erin [name] MRN: [id number] Date of Birth: 09/29/1970 Referring Provider (PT): Mallie Mussel pool MD   Encounter Date: 05/29/2021   PT End of Session - 05/29/21 1711     Visit Number 2    Number of Visits 12    Date for PT Re-Evaluation 07/04/21    Authorization Type FOTO.    PT Start Time 0318    PT Stop Time 0412    PT Time Calculation (min) 54 min    Activity Tolerance Patient tolerated treatment well    Behavior During Therapy WFL for tasks assessed/performed             Past Medical History:  Diagnosis Date   Complication of anesthesia    epidurals do not work.   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   Subjective Assessment - 05/29/21 1712     Subjective COVID-19 screen performed prior to patient entering clinic.  Pain at an 8 yesterday for no apparent reason.  Better today.    Pertinent History Biceps repair (right), mandible surgery.    How long can you sit comfortably? Essentially in good posture.    Currently in Pain? Yes    Pain Score 6     Pain Location Neck    Pain Orientation Left    Pain Descriptors / Indicators Sore    Pain Type Acute pain    Pain Onset 1 to  4 weeks ago                               [facility] Adult PT Treatment/Exercise - 05/29/21 0001       Modalities   Modalities Electrical Stimulation;Moist Heat;Ultrasound      Moist Heat Therapy   Number Minutes Moist Heat 20 Minutes    Moist Heat Location Cervical      Electrical Stimulation   Electrical Stimulation Location Left UT/medial scapular border.    Electrical Stimulation Action IFC at 80-150 Hz.    Electrical Stimulation Parameters 40% scan x 20 minutes.    Electrical Stimulation Goals Pain      Ultrasound   Ultrasound Location Left UT    Ultrasound Parameters Combo e'stim/US at 1.50 W/CM2 x 12 minutes.    Ultrasound Goals Pain      Manual Therapy   Manual Therapy Soft tissue mobilization    Manual therapy comments STW/M x 11 minutes to patient's left UT and medial scpaular border region with ischemic release technique utilized.  PT Long Term Goals - 05/23/21 1429       PT LONG TERM GOAL #1   Title Independent with a HEP.    Time 6    Period Weeks    Status New      PT LONG TERM GOAL #2   Title Increase active cervical rotation to 70 degrees+ so patient can turn head more easily while driving.    Time 6    Period Weeks    Status New      PT LONG TERM GOAL #3   Title Perform ADL's with pain not > 2-3/10    Time 6    Period Weeks    Status New                   Plan - 05/29/21 1715     Clinical Impression Statement Patient did well with treatment today.  She is still tender over her left UT and medial scapular border.  She responded well to STW/M.    Personal Factors and Comorbidities Comorbidity 1;Other    Comorbidities Biceps repair (right), mandible surgery.    Examination-Activity Limitations Other    Examination-Participation Restrictions Other    Stability/Clinical Decision Making Stable/Uncomplicated    Rehab Potential Excellent    PT Frequency 2x / week    PT Duration 6  weeks    PT Treatment/Interventions ADLs/Self Care Home Management;Cryotherapy;Electrical Stimulation;Ultrasound;Traction;Therapeutic activities;Therapeutic exercise;Manual techniques;Passive range of motion;Dry needling    PT Next Visit Plan Combo e'stim/US, STW/M, postural exercises.    Consulted and Agree with Plan of Care Patient             Patient will benefit from skilled therapeutic intervention in order to improve the following deficits and impairments:  Pain, Decreased activity tolerance, Decreased range of motion, Increased muscle spasms  Visit Diagnosis: Cervicalgia  Abnormal posture     Problem List Patient Active Problem List   Diagnosis Date Noted   Breast mass, right 03/12/2013    Malay Fantroy, [place], PT 05/29/2021, 5:17 PM  [facility] [address] [place], [place], [address] Phone: [phone]   Fax:  [phone]  Name: Erin [name] MRN: [id number] Date of Birth: 12-01-1970

## 2021-06-05 ENCOUNTER — Ambulatory Visit: Payer: 59 | Admitting: Physical Therapy

## 2021-06-05 ENCOUNTER — Other Ambulatory Visit: Payer: Self-pay

## 2021-06-05 DIAGNOSIS — M542 Cervicalgia: Secondary | ICD-10-CM | POA: Diagnosis not present

## 2021-06-05 DIAGNOSIS — R293 Abnormal posture: Secondary | ICD-10-CM

## 2021-06-05 NOTE — Therapy (Signed)
[facility] [place], [place], [address] Phone: [phone]   Fax:  [phone]  Physical Therapy Treatment  Patient Details  Name: Erin [name] MRN: [id number] Date of Birth: 1970-11-30 Referring Provider (PT): Mallie Mussel pool MD   Encounter Date: 06/05/2021   PT End of Session - 06/05/21 1712     Visit Number 3    Number of Visits 12    Date for PT Re-Evaluation 07/04/21    Authorization Type FOTO.    PT Start Time 0400    PT Stop Time 0455    PT Time Calculation (min) 55 min    Activity Tolerance Patient tolerated treatment well    Behavior During Therapy WFL for tasks assessed/performed             Past Medical History:  Diagnosis Date   Complication of anesthesia    epidurals do not work.   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   Subjective Assessment - 06/05/21 1653     Subjective COVID-19 screen performed prior to patient entering clinic.  Neck doing okay today.  She has had an occasion or two when she can has had difficulty raising her left arm.  This happende the other day and she turned her head to the left and was able to raise her arm.    Pertinent History Biceps repair (right), mandible surgery.    How long can you sit comfortably? Essentially in good posture.    Patient Stated Goals Get out of  pain.    Currently in Pain? Yes    Pain Score 4     Pain Orientation Left    Pain Descriptors / Indicators Sore    Pain Type Acute pain    Pain Onset 1 to 4 weeks ago    Pain Frequency Constant                               [facility] Adult PT Treatment/Exercise - 06/05/21 0001       Modalities   Modalities Electrical Stimulation;Moist Heat;Ultrasound      Moist Heat Therapy   Number Minutes Moist Heat 20 Minutes    Moist Heat Location Cervical      Electrical Stimulation   Electrical Stimulation Location Left UT    Electrical Stimulation Action IFC at 80-150 Hz.    Electrical Stimulation Parameters 40% scan x 20 minutes.    Electrical Stimulation Goals Pain;Tone      Ultrasound   Ultrasound Location Left UT    Ultrasound Parameters Combo e'stim/US at 1.50 W/CM2 x 12 minutes.    Ultrasound Goals Pain      Manual Therapy   Manual Therapy Soft tissue mobilization  Manual therapy comments STW/M x 11 minutes and also included manual traction.                          PT Long Term Goals - 05/23/21 1429       PT LONG TERM GOAL #1   Title Independent with a HEP.    Time 6    Period Weeks    Status New      PT LONG TERM GOAL #2   Title Increase active cervical rotation to 70 degrees+ so patient can turn head more easily while driving.    Time 6    Period Weeks    Status New      PT LONG TERM GOAL #3   Title Perform ADL's with pain not > 2-3/10    Time 6    Period Weeks    Status New                   Plan - 06/05/21 1709     Clinical Impression Statement The patient presented to the clinic today with a lower pain-level.  She tolerated treatment well today which included manual traction.  We discussed beginning mechanical tarction next visit should she choose to.    Personal Factors and Comorbidities Comorbidity 1;Other    Comorbidities Biceps repair (right), mandible surgery.    Examination-Activity Limitations Other     Examination-Participation Restrictions Other    Stability/Clinical Decision Making Stable/Uncomplicated    Rehab Potential Excellent    PT Frequency 2x / week    PT Duration 6 weeks    PT Treatment/Interventions ADLs/Self Care Home Management;Cryotherapy;Electrical Stimulation;Ultrasound;Traction;Therapeutic activities;Therapeutic exercise;Manual techniques;Passive range of motion;Dry needling    PT Next Visit Plan Combo e'stim/US, STW/M, postural exercises.    Consulted and Agree with Plan of Care Patient             Patient will benefit from skilled therapeutic intervention in order to improve the following deficits and impairments:  Pain, Decreased activity tolerance, Decreased range of motion, Increased muscle spasms  Visit Diagnosis: Cervicalgia  Abnormal posture     Problem List Patient Active Problem List   Diagnosis Date Noted   Breast mass, right 03/12/2013    Erin [name], [place], PT 06/05/2021, 5:13 PM  [facility] [address] [place], [place], [address] Phone: [phone]   Fax:  [phone]  Name: Erin [name] MRN: [id number] Date of Birth: 1970/11/02

## 2021-06-07 ENCOUNTER — Other Ambulatory Visit: Payer: Self-pay

## 2021-06-07 ENCOUNTER — Ambulatory Visit: Payer: 59 | Attending: Neurosurgery | Admitting: Physical Therapy

## 2021-06-07 DIAGNOSIS — R293 Abnormal posture: Secondary | ICD-10-CM | POA: Diagnosis present

## 2021-06-07 DIAGNOSIS — M542 Cervicalgia: Secondary | ICD-10-CM | POA: Diagnosis present

## 2021-06-07 NOTE — Therapy (Signed)
[facility] [place], [place], [address] Phone: [phone]   Fax:  [phone]  Physical Therapy Treatment  Patient Details  Name: Erin [name] MRN: [id number] Date of Birth: May 06, 1971 Referring Provider (PT): Mallie Mussel pool MD   Encounter Date: 06/07/2021   PT End of Session - 06/07/21 1756     Visit Number 4    Number of Visits 12    Date for PT Re-Evaluation 07/04/21    Authorization Type FOTO.    PT Start Time 0400    PT Stop Time 0445    PT Time Calculation (min) 45 min    Activity Tolerance Patient tolerated treatment well    Behavior During Therapy WFL for tasks assessed/performed             Past Medical History:  Diagnosis Date   Complication of anesthesia    epidurals do not work.   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   Subjective Assessment - 06/07/21 1756     Subjective COVID-19 screen performed prior to patient entering clinic.  Patient did well after last treatment an dstates she could mov eher neck to the left better afterwards.    Pertinent History Biceps repair (right), mandible surgery.    How long can you sit comfortably? Essentially in good posture.    Patient Stated Goals Get out of pain.    Currently in Pain? Yes    Pain Score 3     Pain Location Neck    Pain Orientation Left     Pain Descriptors / Indicators Sore    Pain Type Acute pain    Pain Onset 1 to 4 weeks ago    Pain Frequency Constant                [facility] PT Assessment - 06/07/21 0001       AROM   Overall AROM Comments Bilateral active cervical rotation to 60 degrees.                           [facility] Adult PT Treatment/Exercise - 06/07/21 0001       Modalities   Modalities Ultrasound;Traction      Ultrasound   Ultrasound Location Left UT    Ultrasound Parameters Combo e'stim/US at 1.50 W/CM2 x 12 minutes.    Ultrasound Goals Pain      Traction   Type of Traction Cervical    Min (lbs) 5    Max (lbs) 13    Hold Time 99    Rest Time 5    Time 10      Manual Therapy   Manual Therapy Soft tissue mobilization    Soft tissue mobilization STW/M x 11 minutes to patient's  left UT with ischemic release technique utilized.                          PT Long Term Goals - 05/23/21 1429       PT LONG TERM GOAL #1   Title Independent with a HEP.    Time 6    Period Weeks    Status New      PT LONG TERM GOAL #2   Title Increase active cervical rotation to 70 degrees+ so patient can turn head more easily while driving.    Time 6    Period Weeks    Status New      PT LONG TERM GOAL #3   Title Perform ADL's with pain not > 2-3/10    Time 6    Period Weeks    Status New                   Plan - 06/07/21 1800     Clinical Impression Statement Patient did well with last treatment which included manual cervical traction ,therefore, we proceeded with intermittment cervical traction beginning at 13# which she tolerated very well.  Her active left cervical rotation has improved from 50 to 60 degrees.    Personal Factors and Comorbidities Comorbidity 1;Other    Comorbidities Biceps repair (right), mandible surgery.    Examination-Activity Limitations Other    Examination-Participation Restrictions Other    Stability/Clinical Decision Making  Stable/Uncomplicated    Rehab Potential Excellent    PT Frequency 2x / week    PT Duration 6 weeks    PT Treatment/Interventions ADLs/Self Care Home Management;Cryotherapy;Electrical Stimulation;Ultrasound;Traction;Therapeutic activities;Therapeutic exercise;Manual techniques;Passive range of motion;Dry needling    PT Next Visit Plan Int cervical traction at 15#.    Consulted and Agree with Plan of Care Patient             Patient will benefit from skilled therapeutic intervention in order to improve the following deficits and impairments:  Pain, Decreased activity tolerance, Decreased range of motion, Increased muscle spasms  Visit Diagnosis: Cervicalgia  Abnormal posture     Problem List Patient Active Problem List   Diagnosis Date Noted   Breast mass, right 03/12/2013    Pollyann Roa, [place], PT 06/07/2021, 6:04 PM  [facility] [address] [place], [place], [address] Phone: [phone]   Fax:  [phone]  Name: Erin [name] MRN: [id number] Date of Birth: 07-28-1970

## 2021-06-12 ENCOUNTER — Ambulatory Visit: Payer: 59 | Admitting: Physical Therapy

## 2021-06-12 DIAGNOSIS — M542 Cervicalgia: Secondary | ICD-10-CM

## 2021-06-12 DIAGNOSIS — R293 Abnormal posture: Secondary | ICD-10-CM

## 2021-06-12 NOTE — Therapy (Signed)
[facility] [place], [place], [address] Phone: [phone]   Fax:  [phone]  Physical Therapy Treatment  Patient Details  Name: Erin [name] MRN: [id number] Date of Birth: [birth year] Referring Provider (PT): Mallie Mussel pool MD   Encounter Date: 06/12/2021   PT End of Session - 06/12/21 1755     Visit Number 5    Number of Visits 12    Date for PT Re-Evaluation 07/04/21    Authorization Type FOTO.    PT Start Time 0400    PT Stop Time 0444    PT Time Calculation (min) 44 min    Activity Tolerance Patient tolerated treatment well    Behavior During Therapy WFL for tasks assessed/performed             Past Medical History:  Diagnosis Date   Complication of anesthesia    epidurals do not work.   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   Subjective Assessment - 06/12/21 1753     Subjective COVID-19 screen performed prior to patient entering clinic.  Feel like my neck needs to pop.    Pertinent History Biceps repair (right), mandible surgery.    How long can you sit comfortably? Essentially in good posture.    Patient Stated Goals Get out of pain.    Currently in Pain? Yes    Pain Score 2     Pain Location Neck    Pain Orientation Left    Pain Descriptors / Indicators Sore    Pain Type Acute pain                                [facility] Adult PT Treatment/Exercise - 06/12/21 0001       Modalities   Modalities Ultrasound;Traction      Ultrasound   Ultrasound Location Left UT.    Ultrasound Parameters Combo e'stim/US at 1.50 W/CM2 x 12 minutes.    Ultrasound Goals Pain      Traction   Type of Traction Cervical    Min (lbs) 5    Max (lbs) 15    Hold Time 99    Rest Time 5    Time 10      Manual Therapy   Manual Therapy Soft tissue mobilization    Soft tissue mobilization STW/m x 11 minutes to patient's left UT.                          PT Long Term Goals - 05/23/21 1429       PT LONG TERM GOAL #1   Title Independent with a HEP.    Time 6    Period Weeks  Status New      PT LONG TERM GOAL #2   Title Increase active cervical rotation to 70 degrees+ so patient can turn head more easily while driving.    Time 6    Period Weeks    Status New      PT LONG TERM GOAL #3   Title Perform ADL's with pain not > 2-3/10    Time 6    Period Weeks    Status New                   Plan - 06/12/21 1757     Clinical Impression Statement Patient did well with treatment today including intermittment cervical traction at 15#.  Instructed her in towel assisted cervical extension and passive bilateral rotation.    Personal Factors and Comorbidities Comorbidity 1;Other    Comorbidities Biceps repair (right), mandible surgery.    Examination-Activity Limitations Other    Stability/Clinical Decision Making Stable/Uncomplicated    Rehab Potential Excellent    PT Frequency 2x / week    PT Duration 6 weeks    PT Treatment/Interventions ADLs/Self Care Home Management;Cryotherapy;Electrical Stimulation;Ultrasound;Traction;Therapeutic activities;Therapeutic exercise;Manual techniques;Passive range of motion;Dry needling    PT Next Visit Plan Int cervical traction at 17#.    Consulted and Agree with Plan of Care Patient             Patient  will benefit from skilled therapeutic intervention in order to improve the following deficits and impairments:  Pain, Decreased activity tolerance, Decreased range of motion, Increased muscle spasms  Visit Diagnosis: Cervicalgia  Abnormal posture     Problem List Patient Active Problem List   Diagnosis Date Noted   Breast mass, right 03/12/2013    Khiem Gargis, [place], PT 06/12/2021, 6:00 PM  [facility] [address] [place], [place], [address] Phone: [phone]   Fax:  [phone]  Name: Erin [name] MRN: [id number] Date of Birth: 1971/01/05

## 2021-06-15 ENCOUNTER — Ambulatory Visit: Payer: 59 | Admitting: Physical Therapy

## 2021-06-15 ENCOUNTER — Other Ambulatory Visit: Payer: Self-pay

## 2021-06-15 DIAGNOSIS — M542 Cervicalgia: Secondary | ICD-10-CM

## 2021-06-15 DIAGNOSIS — R293 Abnormal posture: Secondary | ICD-10-CM

## 2021-06-15 NOTE — Therapy (Signed)
[facility] [place], [place], [address] Phone: [phone]   Fax:  [phone]  Physical Therapy Treatment  Patient Details  Name: Erin [name] MRN: [id number] Date of Birth: [birth year] Referring Provider (PT): Mallie Mussel pool MD   Encounter Date: 06/15/2021   PT End of Session - 06/15/21 1059     Visit Number 6    Number of Visits 12    Date for PT Re-Evaluation 07/04/21    Authorization Type FOTO.    PT Start Time 0815    PT Stop Time 0908    PT Time Calculation (min) 53 min    Activity Tolerance Patient tolerated treatment well    Behavior During Therapy WFL for tasks assessed/performed             Past Medical History:  Diagnosis Date   Complication of anesthesia    epidurals do not work.   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   Subjective Assessment - 06/15/21 1058     Subjective COVID-19 screen performed prior to patient entering clinic.  Drove four hours.  Neck doing okay.    Pertinent History Biceps repair (right), mandible surgery.    How long can you sit comfortably? Essentially in good posture.    Patient Stated Goals Get out of pain.    Currently in Pain? Yes    Pain Score 3     Pain Location Neck    Pain Orientation Left    Pain Descriptors / Indicators Sore    Pain Type Acute pain     Pain Onset 1 to 4 weeks ago                               [facility] Adult PT Treatment/Exercise - 06/15/21 0001       Modalities   Modalities Ultrasound;Traction      Ultrasound   Ultrasound Location Left UT    Ultrasound Parameters Combo e'stim/US at 1.50 W/CM2 x 12 minutes.    Ultrasound Goals Pain      Traction   Type of Traction Cervical    Min (lbs) 5    Max (lbs) 18    Hold Time 99    Rest Time 5    Time 15      Manual Therapy   Manual Therapy Soft tissue mobilization    Soft tissue mobilization STW/M x 11 minutes to patient's left UT and cervical paraspinal musculature.                          PT Long Term Goals - 05/23/21 1429       PT LONG TERM GOAL #1   Title Independent  with a HEP.    Time 6    Period Weeks    Status New      PT LONG TERM GOAL #2   Title Increase active cervical rotation to 70 degrees+ so patient can turn head more easily while driving.    Time 6    Period Weeks    Status New      PT LONG TERM GOAL #3   Title Perform ADL's with pain not > 2-3/10    Time 6    Period Weeks    Status New                   Plan - 06/15/21 1105     Clinical Impression Statement Patient did great today with treatment.  She feels treatments have been quite helpful.  She is doing well intermittment traction.    Personal Factors and Comorbidities Comorbidity 1;Other    Comorbidities Biceps repair (right), mandible surgery.    Examination-Activity Limitations Other    Examination-Participation Restrictions Other    Stability/Clinical Decision Making Stable/Uncomplicated    Rehab Potential Excellent    PT Frequency 2x / week    PT Duration 6 weeks    PT Treatment/Interventions ADLs/Self Care Home Management;Cryotherapy;Electrical Stimulation;Ultrasound;Traction;Therapeutic activities;Therapeutic exercise;Manual techniques;Passive range of motion;Dry needling    PT Next Visit Plan Int cervical traction at 20#.     Consulted and Agree with Plan of Care Patient             Patient will benefit from skilled therapeutic intervention in order to improve the following deficits and impairments:  Pain, Decreased activity tolerance, Decreased range of motion, Increased muscle spasms  Visit Diagnosis: Cervicalgia  Abnormal posture     Problem List Patient Active Problem List   Diagnosis Date Noted   Breast mass, right 03/12/2013    Mahamud Metts, [place], PT 06/15/2021, 11:25 AM  [facility] [address] [place], [place], [address] Phone: [phone]   Fax:  [phone]  Name: Erin [name] MRN: [id number] Date of Birth: [birth year]

## 2021-06-19 ENCOUNTER — Other Ambulatory Visit: Payer: Self-pay

## 2021-06-19 ENCOUNTER — Ambulatory Visit: Payer: 59 | Admitting: Physical Therapy

## 2021-06-19 DIAGNOSIS — M542 Cervicalgia: Secondary | ICD-10-CM

## 2021-06-19 DIAGNOSIS — R293 Abnormal posture: Secondary | ICD-10-CM

## 2021-06-19 NOTE — Therapy (Signed)
[facility] [place], [place], [address] Phone: [phone]   Fax:  [phone]  Physical Therapy Treatment  Patient Details  Name: SAFIYAH CISNEY MRN: [id number] Date of Birth: 11-Jun-1971 Referring Provider (PT): Mallie Mussel pool MD   Encounter Date: 06/19/2021   PT End of Session - 06/19/21 1724     Visit Number 7    Number of Visits 12    Date for PT Re-Evaluation 07/04/21    Authorization Type FOTO.    PT Start Time 0400    PT Stop Time 0456    PT Time Calculation (min) 56 min    Activity Tolerance Patient tolerated treatment well    Behavior During Therapy WFL for tasks assessed/performed             Past Medical History:  Diagnosis Date   Complication of anesthesia    epidurals do not work.   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   Subjective Assessment - 06/19/21 1725     Subjective COVID-19 screen performed prior to patient entering clinic.  Doing well.  Some tighness on right.    Pertinent History Biceps repair (right), mandible surgery.    How long can you sit comfortably? Essentially in good posture.    Patient Stated Goals Get out of pain.    Currently in Pain? Yes    Pain Score 2     Pain Location Neck    Pain Orientation Left    Pain Type Acute pain    Pain Onset More than a month ago     Pain Frequency Constant                               [facility] Adult PT Treatment/Exercise - 06/19/21 0001       Modalities   Modalities Traction;Ultrasound      Ultrasound   Ultrasound Location UT's.    Ultrasound Parameters Combo e'stim/US at 1.50 W/CM2 x 12 minutes (majority of time spent on left).      Traction   Type of Traction Cervical    Min (lbs) 5    Max (lbs) 19    Hold Time 99    Rest Time 5    Time 15      Manual Therapy   Manual Therapy Soft tissue mobilization    Soft tissue mobilization STW/M x 11 minutes to UT's (majority of time spent on left).                          PT Long Term Goals - 05/23/21 1429       PT LONG TERM GOAL #1   Title Independent with a HEP.  Time 6    Period Weeks    Status New      PT LONG TERM GOAL #2   Title Increase active cervical rotation to 70 degrees+ so patient can turn head more easily while driving.    Time 6    Period Weeks    Status New      PT LONG TERM GOAL #3   Title Perform ADL's with pain not > 2-3/10    Time 6    Period Weeks    Status New                   Plan - 06/19/21 1728     Clinical Impression Statement Patient, overall, is doing well.  Some tightness reported on right today.  Increase intermittemt by 1# no no complaint.    Personal Factors and Comorbidities Comorbidity 1;Other    Comorbidities Biceps repair (right), mandible surgery.    Examination-Activity Limitations Other    Examination-Participation Restrictions Other    Stability/Clinical Decision Making Stable/Uncomplicated    Rehab Potential Excellent    PT Frequency 2x / week    PT Duration 6 weeks    PT Treatment/Interventions ADLs/Self Care Home Management;Cryotherapy;Electrical Stimulation;Ultrasound;Traction;Therapeutic activities;Therapeutic exercise;Manual techniques;Passive range of motion;Dry needling    PT Next Visit Plan Int cervical traction at 20#.    Consulted and Agree  with Plan of Care Patient             Patient will benefit from skilled therapeutic intervention in order to improve the following deficits and impairments:  Pain, Decreased activity tolerance, Decreased range of motion, Increased muscle spasms  Visit Diagnosis: Cervicalgia  Abnormal posture     Problem List Patient Active Problem List   Diagnosis Date Noted   Breast mass, right 03/12/2013    Nidya Bouyer, [place], PT 06/19/2021, 5:30 PM  [facility] [address] [place], [place], [address] Phone: [phone]   Fax:  [phone]  Name: CAMRY ROBELLO MRN: [id number] Date of Birth: [birth year]

## 2021-06-22 ENCOUNTER — Other Ambulatory Visit: Payer: Self-pay

## 2021-06-22 ENCOUNTER — Encounter: Payer: Self-pay | Admitting: Physical Therapy

## 2021-06-22 ENCOUNTER — Ambulatory Visit: Payer: 59 | Admitting: Physical Therapy

## 2021-06-22 DIAGNOSIS — M542 Cervicalgia: Secondary | ICD-10-CM

## 2021-06-22 DIAGNOSIS — R293 Abnormal posture: Secondary | ICD-10-CM

## 2021-06-22 NOTE — Therapy (Signed)
[facility] [place], [place], [address] Phone: [phone]   Fax:  [phone]  Physical Therapy Treatment  Patient Details  Name: Erin [name] MRN: [id number] Date of Birth: 1971-01-25 Referring Provider (PT): Mallie Mussel pool MD   Encounter Date: 06/22/2021   PT End of Session - 06/22/21 1123     Visit Number 8    Number of Visits 12    Date for PT Re-Evaluation 07/04/21    Authorization Type FOTO.    PT Start Time 1123    PT Stop Time 1212    PT Time Calculation (min) 49 min    Activity Tolerance Patient tolerated treatment well    Behavior During Therapy WFL for tasks assessed/performed             Past Medical History:  Diagnosis Date   Complication of anesthesia    epidurals do not work.   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   Subjective Assessment - 06/22/21 1116     Subjective COVID-19 screen performed prior to patient entering clinic. No pain, just a little tightness    Pertinent History Biceps repair (right), mandible surgery.    How long can you sit comfortably? Essentially in good posture.    Patient Stated Goals Get out of pain.    Currently in Pain? No/denies                [facility] PT Assessment - 06/22/21 0001       Assessment   Medical Diagnosis Cervical spondylosis     Referring Provider (PT) Mallie Mussel pool MD      Precautions   Precautions None                           [facility] Adult PT Treatment/Exercise - 06/22/21 0001       Modalities   Modalities Ultrasound;Traction      Ultrasound   Ultrasound Location L UT    Ultrasound Parameters Combo 1.5 w/cm2, 100%, 1 mhz x10 min    Ultrasound Goals Pain      Traction   Type of Traction Cervical    Min (lbs) 5    Max (lbs) 19    Hold Time 99    Rest Time 5    Time 15      Manual Therapy   Manual Therapy Soft tissue mobilization    Soft tissue mobilization STW to L UT to reduce tone and discomfort                          PT Long Term Goals - 05/23/21 1429       PT LONG TERM GOAL #1   Title Independent  with a HEP.    Time 6    Period Weeks    Status New      PT LONG TERM GOAL #2   Title Increase active cervical rotation to 70 degrees+ so patient can turn head more easily while driving.    Time 6    Period Weeks    Status New      PT LONG TERM GOAL #3   Title Perform ADL's with pain not > 2-3/10    Time 6    Period Weeks    Status New                   Plan - 06/22/21 1222     Clinical Impression Statement Patient presented in clinic with reports of only tightness especially at end range cervical rotation. Patient presented with mod increased tone palpable in the L UT especially. No complaints of pain during STW. Traction held off at 19# max per patient report of suboccipital region headache after last treatment.    Personal Factors and Comorbidities Comorbidity 1;Other    Comorbidities Biceps repair (right), mandible surgery.    Examination-Activity Limitations Other    Examination-Participation Restrictions Other    Stability/Clinical Decision Making Stable/Uncomplicated    Rehab Potential Excellent    PT Frequency 2x / week    PT Duration 6 weeks    PT Treatment/Interventions ADLs/Self Care Home Management;Cryotherapy;Electrical  Stimulation;Ultrasound;Traction;Therapeutic activities;Therapeutic exercise;Manual techniques;Passive range of motion;Dry needling    PT Next Visit Plan Monitor symptoms following traction.    Consulted and Agree with Plan of Care Patient             Patient will benefit from skilled therapeutic intervention in order to improve the following deficits and impairments:  Pain, Decreased activity tolerance, Decreased range of motion, Increased muscle spasms  Visit Diagnosis: Cervicalgia  Abnormal posture     Problem List Patient Active Problem List   Diagnosis Date Noted   Breast mass, right 03/12/2013    Standley Brooking, PTA 06/22/2021, 12:31 PM  [facility] [place], [place], [address] Phone: [phone]   Fax:  [phone]  Name: Erin [name] MRN: [id number] Date of Birth: 07-07-1971

## 2021-06-25 ENCOUNTER — Ambulatory Visit: Payer: 59 | Admitting: *Deleted

## 2021-06-25 ENCOUNTER — Other Ambulatory Visit: Payer: Self-pay

## 2021-06-25 DIAGNOSIS — M542 Cervicalgia: Secondary | ICD-10-CM

## 2021-06-25 DIAGNOSIS — R293 Abnormal posture: Secondary | ICD-10-CM

## 2021-06-25 NOTE — Therapy (Signed)
[facility] [place], [place], [address] Phone: [phone]   Fax:  [phone]  Physical Therapy Treatment  Patient Details  Name: Erin [name] MRN: [id number] Date of Birth: 05-20-1971 Referring Provider (PT): Mallie Mussel pool MD   Encounter Date: 06/25/2021   PT End of Session - 06/25/21 0854     Visit Number 9    Number of Visits 12    Date for PT Re-Evaluation 07/04/21    Authorization Type FOTO.    PT Start Time 0815    PT Stop Time 0909    PT Time Calculation (min) 54 min             Past Medical History:  Diagnosis Date   Complication of anesthesia    epidurals do not work.   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   Subjective Assessment - 06/25/21 0853     Subjective COVID-19 screen performed prior to patient entering clinic. No pain, just a little tightness LT side 3-4/10    Pertinent History Biceps repair (right), mandible surgery.    How long can you sit comfortably? Essentially in good posture.    Patient Stated Goals Get out of pain.    Currently in Pain? Yes    Pain Score 4     Pain Location Neck    Pain Orientation Left    Pain Descriptors / Indicators Sore    Pain Type Acute pain                               [facility] Adult PT Treatment/Exercise - 06/25/21 0001        Modalities   Modalities Ultrasound;Traction      Ultrasound   Ultrasound Location L UT    Ultrasound Parameters Combo 1.5 w/c 2  x10 mins    Ultrasound Goals Pain      Traction   Type of Traction Cervical    Min (lbs) 5    Max (lbs) 20    Hold Time 99    Rest Time 5    Time 15      Manual Therapy   Manual Therapy Soft tissue mobilization    Soft tissue mobilization STW to L UT and cerv. paras to reduce tone and discomfort                          PT Long Term Goals - 05/23/21 1429       PT LONG TERM GOAL #1   Title Independent with a HEP.    Time 6    Period Weeks    Status New      PT LONG TERM GOAL #2  Title Increase active cervical rotation to 70 degrees+ so patient can turn head more easily while driving.    Time 6    Period Weeks    Status New      PT LONG TERM GOAL #3   Title Perform ADL's with pain not > 2-3/10    Time 6    Period Weeks    Status New                   Plan - 06/25/21 0859     Clinical Impression Statement Pt arrived today with increased LT sided pain UT and Cervical paras. She did well with [place] combo and STW/TPR with decreased mm tension. Traction to 20#s today and tolerated well    Personal Factors and Comorbidities Comorbidity 1;Other    Comorbidities Biceps repair (right), mandible surgery.    Examination-Activity Limitations Other    Examination-Participation Restrictions Other    Stability/Clinical Decision Making Stable/Uncomplicated    Rehab Potential Excellent    PT Frequency 2x / week    PT Duration 6 weeks    PT Treatment/Interventions ADLs/Self Care Home Management;Cryotherapy;Electrical Stimulation;Ultrasound;Traction;Therapeutic activities;Therapeutic exercise;Manual techniques;Passive range of motion;Dry needling    PT Next Visit Plan Monitor symptoms following traction.    Consulted and Agree with Plan of Care Patient             Patient will benefit from skilled therapeutic  intervention in order to improve the following deficits and impairments:     Visit Diagnosis: Cervicalgia  Abnormal posture     Problem List Patient Active Problem List   Diagnosis Date Noted   Breast mass, right 03/12/2013    Tatyanna Cronk,CHRIS, PTA 06/25/2021, 9:19 AM  [facility] [address] [place], [place], [address] Phone: [phone]   Fax:  [phone]  Name: Erin [name] MRN: [id number] Date of Birth: [birth year]

## 2021-06-27 ENCOUNTER — Encounter: Payer: 59 | Admitting: Physical Therapy

## 2021-07-03 ENCOUNTER — Ambulatory Visit: Payer: 59 | Admitting: Physical Therapy

## 2021-07-05 ENCOUNTER — Encounter: Payer: 59 | Admitting: Physical Therapy

## 2021-11-14 LAB — TSH: TSH: 2.03 (ref 0.41–5.90)

## 2022-04-12 ENCOUNTER — Other Ambulatory Visit: Payer: Self-pay | Admitting: Obstetrics and Gynecology

## 2022-04-12 DIAGNOSIS — Z1231 Encounter for screening mammogram for malignant neoplasm of breast: Secondary | ICD-10-CM

## 2022-05-20 ENCOUNTER — Other Ambulatory Visit: Payer: Self-pay | Admitting: Nurse Practitioner

## 2022-05-20 ENCOUNTER — Ambulatory Visit
Admission: RE | Admit: 2022-05-20 | Discharge: 2022-05-20 | Disposition: A | Discharge status: Home / Self Care | Payer: 59 | Source: Ambulatory Visit | Attending: Obstetrics and Gynecology | Admitting: Obstetrics and Gynecology

## 2022-05-20 DIAGNOSIS — N644 Mastodynia: Secondary | ICD-10-CM

## 2022-05-20 DIAGNOSIS — Z1231 Encounter for screening mammogram for malignant neoplasm of breast: Secondary | ICD-10-CM

## 2022-05-21 ENCOUNTER — Other Ambulatory Visit: Payer: Self-pay | Admitting: Nurse Practitioner

## 2022-05-21 ENCOUNTER — Other Ambulatory Visit: Payer: Self-pay | Admitting: Obstetrics and Gynecology

## 2022-05-21 DIAGNOSIS — N644 Mastodynia: Secondary | ICD-10-CM

## 2022-06-04 ENCOUNTER — Ambulatory Visit
Admission: RE | Admit: 2022-06-04 | Discharge: 2022-06-04 | Disposition: A | Discharge status: Home / Self Care | Payer: 59 | Source: Ambulatory Visit | Attending: Obstetrics and Gynecology | Admitting: Obstetrics and Gynecology

## 2022-06-04 DIAGNOSIS — N644 Mastodynia: Secondary | ICD-10-CM

## 2022-06-13 ENCOUNTER — Other Ambulatory Visit: Payer: Self-pay

## 2022-06-13 ENCOUNTER — Ambulatory Visit: Payer: 59 | Attending: Student | Admitting: Physical Therapy

## 2022-06-13 ENCOUNTER — Encounter: Payer: Self-pay | Admitting: Physical Therapy

## 2022-06-13 DIAGNOSIS — M542 Cervicalgia: Secondary | ICD-10-CM | POA: Insufficient documentation

## 2022-06-13 DIAGNOSIS — R293 Abnormal posture: Secondary | ICD-10-CM | POA: Diagnosis present

## 2022-06-13 NOTE — Therapy (Signed)
OUTPATIENT PHYSICAL THERAPY CERVICAL EVALUATION   Patient Name: Erin [name] MRN: [id number] DOB:Dec 23, 1970, 51 y.o., female Today's Date: 06/13/2022  END OF SESSION:  PT End of Session - 06/13/22 1547     Visit Number 1    Number of Visits 8    Date for PT Re-Evaluation 07/25/22    Authorization Type FOTO.    PT Start Time 0309    PT Stop Time 0405    PT Time Calculation (min) 56 min    Activity Tolerance Patient tolerated treatment well    Behavior During Therapy WFL for tasks assessed/performed             Past Medical History:  Diagnosis Date   Complication of anesthesia    epidurals do not work.   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   suspected on mammogram with planned expected management   GERD (gastroesophageal reflux disease)    Prilocsec OTC if needed   H/O hiatal hernia    Past Surgical History:  Procedure Laterality Date   BREAST BIOPSY Right 2014   Benign [place] Core    BREAST EXCISIONAL BIOPSY Right    BREAST LUMPECTOMY WITH NEEDLE LOCALIZATION Right 03/31/2013   Procedure: BREAST LUMPECTOMY WITH NEEDLE LOCALIZATION;  Surgeon: Adin Hector, MD;  Location: [facility];  Service: General;  Laterality: Right;   EYE SURGERY  2001   [place]  2005   postpartum   Patient Active Problem List   Diagnosis Date Noted   Breast mass, right 03/12/2013     REFERRING PROVIDER: Viona Gilmore NP.  REFERRING DIAG: Cervical Spondylosis.  THERAPY DIAG:  No diagnosis found.  Rationale for Evaluation and Treatment: Rehabilitation  ONSET DATE: Ongoing.  SUBJECTIVE:                                                                                                                                                                                                         SUBJECTIVE STATEMENT: The patient presents to the clinic today with c/o left-sided neck pain that has been a problem for a couple of years but she had a recent flare-up turning  her head a lot while driving that resulted in a pain-level around an 8/10.  Today, art rest her pain is a 2/10 but her neck can flare-up relatively easy.  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 Patient had PT in the past and did well which included traction.   PERTINENT HISTORY:  Mandible surgery, right biceps  repair.  PAIN:  Are you having pain? Yes: NPRS scale: 2/10 Pain location: Left cervical. Pain description: Ache. Aggravating factors: Turning head, jarring motions. Relieving factors: Rest.  PRECAUTIONS: None  WEIGHT BEARING RESTRICTIONS: No  FALLS:  Has patient fallen in last 6 months? No  LIVING ENVIRONMENT: Lives with: lives with their spouse Lives in: House/apartment   OCCUPATION: Computer work.  PLOF: Independent  PATIENT GOALS: Not flare-up so easily.  NEXT MD VISIT:   OBJECTIVE:     PATIENT SURVEYS:  FOTO Complete.  POSTURE: rounded shoulders and forward head  PALPATION: Tender to palpation in left lower cervical region and increased tone over her left UT.   CERVICAL ROM:   Active right cervical rotation is 60 degrees and left is 55 degrees.  DTR's:  Normal bilateral UE DTR's.  UPPER EXTREMITY MMT:  Normal UE strength.   TODAY'S TREATMENT:                                                                                                                              DATE: HMP x 10 minutes to patient's left UT f/b intermittent traction at 14# with 99 sec hold and a 5 sec rest.  Patient tolerated treatment well.     PATIENT EDUCATION:  Education details: Chin tucks and extension. Person educated: Patient Education method: Explanation, Demonstration, and Handouts Education comprehension: verbalized understanding and returned demonstration  HOME EXERCISE PROGRAM: As above.  ASSESSMENT:  CLINICAL IMPRESSION: The patient presents to [facility] with c/o left-sided neck pain that has been an ongoing problem.  She  had PT in the past which was helpful.  A recent flare-up due to turning her head while driving was very bad.  She has limited active bilateral rotation left > right.  She has normal UE strength and are UE DTR's are normal as well.  She works from home at a computer.  We discussed the importance of correct posture and instructed her in chin tucks and extension.  Patient will benefit from skilled physical therapy intervention to address pain and deficits.  OBJECTIVE IMPAIRMENTS: decreased activity tolerance, increased muscle spasms, postural dysfunction, and pain.   ACTIVITY LIMITATIONS: lifting  PARTICIPATION LIMITATIONS: yard work  PERSONAL FACTORS: Time since onset of injury/illness/exacerbation are also affecting patient's functional outcome.   REHAB POTENTIAL: Excellent  CLINICAL DECISION MAKING: Stable/uncomplicated  EVALUATION COMPLEXITY: Low   GOALS: LONG TERM GOALS: Target date: 08/08/22  Ind with a HEP. Baseline:  Goal status: INITIAL  2.  Improve bilateral active cervical rotation to 65 degrees+ for greater ease of turning head while driving. Baseline:  Goal status: INITIAL  3.  Perform ADL's with average pain-level not exceeding a 2-3/10. Baseline:  Goal status: INITIAL   PLAN:  PT FREQUENCY: 1-2x/week  PT DURATION: other: 8 visits.  PLANNED INTERVENTIONS: Therapeutic exercises, Therapeutic activity, Patient/Family education, Self Care, Dry Needling, Electrical stimulation, Cryotherapy, Moist heat, Traction, Ultrasound, and Manual therapy  PLAN FOR NEXT SESSION: Modalities  and STW/M as needed, int traction at 15#, chin tucks, towel assisted cervical extension.   Eulon Allnutt, [place], PT 06/13/2022, 4:11 PM

## 2022-06-18 ENCOUNTER — Encounter: Payer: Self-pay | Admitting: Physical Therapy

## 2022-06-18 ENCOUNTER — Ambulatory Visit: Payer: 59 | Admitting: Physical Therapy

## 2022-06-18 DIAGNOSIS — M542 Cervicalgia: Secondary | ICD-10-CM

## 2022-06-18 DIAGNOSIS — R293 Abnormal posture: Secondary | ICD-10-CM

## 2022-06-18 NOTE — Therapy (Signed)
OUTPATIENT PHYSICAL THERAPY CERVICAL EVALUATION   Patient Name: Erin [name] MRN: [id number] DOB:1971-05-11, 51 y.o., female Today's Date: 06/18/2022  END OF SESSION:  PT End of Session - 06/18/22 1600     Visit Number 2    Number of Visits 8    Date for PT Re-Evaluation 07/25/22    Authorization Type FOTO.    PT Start Time 1523    PT Stop Time 1609    PT Time Calculation (min) 46 min    Activity Tolerance Patient tolerated treatment well    Behavior During Therapy WFL for tasks assessed/performed            Past Medical History:  Diagnosis Date   Complication of anesthesia    epidurals do not work.   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   suspected on mammogram with planned expected management   GERD (gastroesophageal reflux disease)    Prilocsec OTC if needed   H/O hiatal hernia    Past Surgical History:  Procedure Laterality Date   BREAST BIOPSY Right 2014   Benign [place] Core    BREAST EXCISIONAL BIOPSY Right    BREAST LUMPECTOMY WITH NEEDLE LOCALIZATION Right 03/31/2013   Procedure: BREAST LUMPECTOMY WITH NEEDLE LOCALIZATION;  Surgeon: Adin Hector, MD;  Location: [facility];  Service: General;  Laterality: Right;   EYE SURGERY  2001   [place]  2005   postpartum   Patient Active Problem List   Diagnosis Date Noted   Breast mass, right 03/12/2013   REFERRING PROVIDER: Viona Gilmore NP.  REFERRING DIAG: Cervical Spondylosis.  THERAPY DIAG:  Cervicalgia  Abnormal posture  Rationale for Evaluation and Treatment: Rehabilitation  ONSET DATE: Ongoing.  SUBJECTIVE:                                                                                                                                                                                                         SUBJECTIVE STATEMENT: Has pain with lifting buckets of feed. Had to switch buckets last night as one was heavier than the other. Likes her pillow she uses. Has been doing  chin tucks this week.  PERTINENT HISTORY:  Mandible surgery, right biceps repair.  PAIN:  Are you having pain? Yes: NPRS scale: 2/10 Pain location: Left cervical. Pain description: Ache. Aggravating factors: Turning head, jarring motions. Relieving factors: Rest.  PRECAUTIONS: None  NEXT MD VISIT:   OBJECTIVE:   PATIENT SURVEYS:  FOTO Complete.  TODAY'S TREATMENT:  DATE:06/18/22 Modalities  Date:  Traction: cervical, 15# max, 5# min 99/5, 15 mins mins Combo: Shoulder, 1.5 w/cm2, 100%, 10 mins, Pain and Tone  Manual Therapy Soft Tissue Mobilization: L UT, to reduce tone and pain    PATIENT EDUCATION:  Education details: Chin tucks and extension. Person educated: Patient Education method: Explanation, Demonstration, and Handouts Education comprehension: verbalized understanding and returned demonstration  HOME EXERCISE PROGRAM: As above.  ASSESSMENT:  CLINICAL IMPRESSION: Patient presented in clinic with   OBJECTIVE IMPAIRMENTS: decreased activity tolerance, increased muscle spasms, postural dysfunction, and pain.   ACTIVITY LIMITATIONS: lifting  PARTICIPATION LIMITATIONS: yard work  PERSONAL FACTORS: Time since onset of injury/illness/exacerbation are also affecting patient's functional outcome.   REHAB POTENTIAL: Excellent  CLINICAL DECISION MAKING: Stable/uncomplicated  EVALUATION COMPLEXITY: Low   GOALS: LONG TERM GOALS: Target date: 08/08/22  Ind with a HEP. Baseline:  Goal status: INITIAL  2.  Improve bilateral active cervical rotation to 65 degrees+ for greater ease of turning head while driving. Baseline:  Goal status: INITIAL  3.  Perform ADL's with average pain-level not exceeding a 2-3/10. Baseline:  Goal status: INITIAL   PLAN:  PT FREQUENCY: 1-2x/week  PT DURATION: other: 8 visits.  PLANNED  INTERVENTIONS: Therapeutic exercises, Therapeutic activity, Patient/Family education, Self Care, Dry Needling, Electrical stimulation, Cryotherapy, Moist heat, Traction, Ultrasound, and Manual therapy  PLAN FOR NEXT SESSION: Modalities and STW/M as needed, int traction at 15#, chin tucks, towel assisted cervical extension.   Standley Brooking, PTA 06/18/2022, 4:12 PM

## 2022-06-25 ENCOUNTER — Ambulatory Visit: Payer: 59 | Admitting: Physical Therapy

## 2022-06-25 DIAGNOSIS — M542 Cervicalgia: Secondary | ICD-10-CM | POA: Diagnosis not present

## 2022-06-25 DIAGNOSIS — R293 Abnormal posture: Secondary | ICD-10-CM

## 2022-06-25 NOTE — Therapy (Signed)
OUTPATIENT PHYSICAL THERAPY CERVICAL EVALUATION   Patient Name: Erin [name] MRN: [id number] DOB:[birth year], 51 y.o., female Today's Date: 06/25/2022  END OF SESSION:  PT End of Session - 06/25/22 1620     Visit Number 3    Number of Visits 8    Date for PT Re-Evaluation 07/25/22    Authorization Type FOTO.    PT Start Time 0310    PT Stop Time 0400    PT Time Calculation (min) 50 min    Activity Tolerance Patient tolerated treatment well    Behavior During Therapy WFL for tasks assessed/performed            Past Medical History:  Diagnosis Date   Complication of anesthesia    epidurals do not work.   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   suspected on mammogram with planned expected management   GERD (gastroesophageal reflux disease)    Prilocsec OTC if needed   H/O hiatal hernia    Past Surgical History:  Procedure Laterality Date   BREAST BIOPSY Right 2014   Benign [place] Core    BREAST EXCISIONAL BIOPSY Right    BREAST LUMPECTOMY WITH NEEDLE LOCALIZATION Right 03/31/2013   Procedure: BREAST LUMPECTOMY WITH NEEDLE LOCALIZATION;  Surgeon: Adin Hector, MD;  Location: [facility];  Service: General;  Laterality: Right;   EYE SURGERY  2001   [place]  2005   postpartum   Patient Active Problem List   Diagnosis Date Noted   Breast mass, right 03/12/2013   REFERRING PROVIDER: Viona Gilmore NP.  REFERRING DIAG: Cervical Spondylosis.  THERAPY DIAG:  Cervicalgia  Abnormal posture  Rationale for Evaluation and Treatment: Rehabilitation  ONSET DATE: Ongoing.  SUBJECTIVE:                                                                                                                                                                                                         SUBJECTIVE STATEMENT:  Patient not wanting traction again.  She is alos trying to be more aware of what triggers her neck pain.  She carries heavy feed bags and is trying  to hold them differently and stop periodically as opposed to carry them long distances without rest.   PERTINENT HISTORY:  Mandible surgery, right biceps repair.  PAIN:  Are you having pain? Yes: NPRS scale: 3-4/10 Pain location: Left cervical. Pain description: Ache. Aggravating factors: Turning head, jarring motions. Relieving factors: Rest.  PRECAUTIONS: None  NEXT MD VISIT:   OBJECTIVE:   PATIENT  SURVEYS:  FOTO Complete.  TODAY'S TREATMENT:                                                                                                                              DATE:06/25/22 Modalities  Combo e'stim/US at 1.50 W/CM2 x 12 minutes over patient's left UT f/b STW/M x 11 minutes with ischemic release technique utilized f/b IFC at 80-150 Hz on 40% scan x 20 minutes.  Manual Therapy Soft Tissue Mobilization: L UT, to reduce tone and pain x 11 minutes.   PATIENT EDUCATION:  Education details: Towel cervical rotation stretch Person educated: Patient Education method: Explanation, Demonstration, and Handouts Education comprehension: verbalized understanding and returned demonstration  HOME EXERCISE PROGRAM: HOME EXERCISE PROGRAM Created by [place] Hermes Wafer Dec 19th, 2023 View at [email or web address] using code: [email or web address]  Page 1 of 1  1 Exercise SNAG Using a towel draped around your neck, begin with one hand stabilizing one end of towel with light downward pull. Next, turn your head as far to one direction as able (as in picture). Use opposite hand to gently pull towel across your face making sure not to pull across your jaw to allow for light stretch into rotational direction. Repeat 5 Times Hold 5 Seconds Complete 1 Set Perform 3 Times  ASSESSMENT:  CLINICAL IMPRESSION: Patient presented to clinic with a great deal of tone over her left UT.  She did well with treatment today.  D/cing traction.  We discussed dry needling a consent form was provided to the  patient.  OBJECTIVE IMPAIRMENTS: decreased activity tolerance, increased muscle spasms, postural dysfunction, and pain.   ACTIVITY LIMITATIONS: lifting  PARTICIPATION LIMITATIONS: yard work  PERSONAL FACTORS: Time since onset of injury/illness/exacerbation are also affecting patient's functional outcome.   REHAB POTENTIAL: Excellent  CLINICAL DECISION MAKING: Stable/uncomplicated  EVALUATION COMPLEXITY: Low   GOALS: LONG TERM GOALS: Target date: 08/08/22  Ind with a HEP. Baseline:  Goal status: INITIAL  2.  Improve bilateral active cervical rotation to 65 degrees+ for greater ease of turning head while driving. Baseline:  Goal status: INITIAL  3.  Perform ADL's with average pain-level not exceeding a 2-3/10. Baseline:  Goal status: INITIAL   PLAN:  PT FREQUENCY: 1-2x/week  PT DURATION: other: 8 visits.  PLANNED INTERVENTIONS: Therapeutic exercises, Therapeutic activity, Patient/Family education, Self Care, Dry Needling, Electrical stimulation, Cryotherapy, Moist heat, Traction, Ultrasound, and Manual therapy  PLAN FOR NEXT SESSION: Modalities and STW/M as needed, int traction at 15#, chin tucks, towel assisted cervical extension.   Lauryn Lizardi, [place], PT 06/25/2022, 4:29 PM

## 2022-07-02 ENCOUNTER — Encounter: Payer: Self-pay | Admitting: *Deleted

## 2022-07-02 ENCOUNTER — Ambulatory Visit: Payer: 59 | Admitting: *Deleted

## 2022-07-02 DIAGNOSIS — M542 Cervicalgia: Secondary | ICD-10-CM

## 2022-07-02 DIAGNOSIS — R293 Abnormal posture: Secondary | ICD-10-CM

## 2022-07-02 NOTE — Therapy (Signed)
OUTPATIENT PHYSICAL THERAPY CERVICAL EVALUATION   Patient Name: Erin [name] MRN: [id number] DOB:1970/09/23, 51 y.o., female Today's Date: 07/02/2022  END OF SESSION:  PT End of Session - 07/02/22 0818     Visit Number 4    Number of Visits 8    Date for PT Re-Evaluation 07/25/22    Authorization Type FOTO.    PT Start Time 0815    PT Stop Time 0905    PT Time Calculation (min) 50 min            Past Medical History:  Diagnosis Date   Complication of anesthesia    epidurals do not work.   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;  Service: General;  Laterality: Right;   EYE SURGERY  2001   [place]  2005   postpartum   Patient Active Problem List   Diagnosis Date Noted   Breast mass, right 03/12/2013   REFERRING PROVIDER: Viona Gilmore NP.  REFERRING DIAG: Cervical Spondylosis.  THERAPY DIAG:  Cervicalgia  Abnormal posture  Rationale for Evaluation and Treatment: Rehabilitation  ONSET DATE: Ongoing.  SUBJECTIVE:                                                                                                                                                                                                         SUBJECTIVE STATEMENT:  Patient not wanting traction again.   Did okay after last Rx. Lots of tightness today   PERTINENT HISTORY:  Mandible surgery, right biceps repair.     Cervical bone spurs  PAIN:  Are you having pain? Yes: NPRS scale: 2/10 Pain location: Left cervical. Pain  description: Ache. Aggravating factors: Turning head, jarring motions. Relieving factors: Rest.  PRECAUTIONS: None  NEXT MD VISIT:   OBJECTIVE:   PATIENT SURVEYS:  FOTO Complete.  TODAY'S TREATMENT:  DATE:                                                             07/02/22 Modalities  Combo e'stim/US at 1.50 W/CM2 x 12 minutes over patient's Bil. UT's   STW/M x 12 minutes with ischemic release technique utilized   IFC at 80-150 Hz on 40% scan x 15 minutes.  Manual Therapy    PATIENT EDUCATION:  Education details: Towel cervical rotation stretch Person educated: Patient Education method: Explanation, Demonstration, and Handouts Education comprehension: verbalized understanding and returned demonstration  HOME EXERCISE PROGRAM: HOME EXERCISE PROGRAM Created by Mali Applegate Dec 19th, 2023 View at [email or web address] using code: [email or web address]  Page 1 of 1  1 Exercise SNAG Using a towel draped around your neck, begin with one hand stabilizing one end of towel with light downward pull. Next, turn your head as far to one direction as able (as in picture). Use opposite hand to gently pull towel across your face making sure not to pull across your jaw to allow for light stretch into rotational direction. Repeat 5 Times Hold 5 Seconds Complete 1 Set Perform 3 Times  ASSESSMENT:  CLINICAL IMPRESSION: Patient Arrived today doing fairly well after last Rx with less pain , but still with tightness. Today Pt had tightness Bil. UT's and levator scap and did well with TP release both sides.  IFC end of session  OBJECTIVE IMPAIRMENTS: decreased activity tolerance, increased muscle spasms, postural dysfunction, and pain.   ACTIVITY LIMITATIONS: lifting  PARTICIPATION LIMITATIONS: yard work  PERSONAL FACTORS: Time since onset of injury/illness/exacerbation  are also affecting patient's functional outcome.   REHAB POTENTIAL: Excellent  CLINICAL DECISION MAKING: Stable/uncomplicated  EVALUATION COMPLEXITY: Low   GOALS: LONG TERM GOALS: Target date: 08/08/22  Ind with a HEP. Baseline:  Goal status: INITIAL  2.  Improve bilateral active cervical rotation to 65 degrees+ for greater ease of turning head while driving. Baseline:  Goal status: INITIAL  3.  Perform ADL's with average pain-level not exceeding a 2-3/10. Baseline:  Goal status: INITIAL   PLAN:  PT FREQUENCY: 1-2x/week  PT DURATION: other: 8 visits.  PLANNED INTERVENTIONS: Therapeutic exercises, Therapeutic activity, Patient/Family education, Self Care, Dry Needling, Electrical stimulation, Cryotherapy, Moist heat, Traction, Ultrasound, and Manual therapy  PLAN FOR NEXT SESSION: Modalities and STW/M as needed,  chin tucks, towel assisted cervical extension.   Dejai Schubach,CHRIS, PTA 07/02/2022, 9:25 AM

## 2022-07-09 ENCOUNTER — Ambulatory Visit: Payer: 59 | Attending: Student | Admitting: Physical Therapy

## 2022-07-09 ENCOUNTER — Encounter: Payer: Self-pay | Admitting: Physical Therapy

## 2022-07-09 DIAGNOSIS — M542 Cervicalgia: Secondary | ICD-10-CM | POA: Diagnosis not present

## 2022-07-09 DIAGNOSIS — R293 Abnormal posture: Secondary | ICD-10-CM | POA: Diagnosis present

## 2022-07-09 NOTE — Therapy (Signed)
OUTPATIENT PHYSICAL THERAPY CERVICAL EVALUATION   Patient Name: Erin [name] MRN: [id number] DOB:1971-04-08, 52 y.o., female Today's Date: 07/09/2022  END OF SESSION:  PT End of Session - 07/09/22 1611     Visit Number 5    Number of Visits 8    Authorization Type FOTO.    PT Start Time 0315    PT Stop Time 0406    PT Time Calculation (min) 51 min    Activity Tolerance Patient tolerated treatment well    Behavior During Therapy WFL for tasks assessed/performed            Past Medical History:  Diagnosis Date   Complication of anesthesia    epidurals do not work.   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   suspected on mammogram with planned expected management   GERD (gastroesophageal reflux disease)    Prilocsec OTC if needed   H/O hiatal hernia    Past Surgical History:  Procedure Laterality Date   BREAST BIOPSY Right 2014   Benign [place] Core    BREAST EXCISIONAL BIOPSY Right    BREAST LUMPECTOMY WITH NEEDLE LOCALIZATION Right 03/31/2013   Procedure: BREAST LUMPECTOMY WITH NEEDLE LOCALIZATION;  Surgeon: Adin Hector, MD;  Location: [facility];  Service: General;  Laterality: Right;   EYE SURGERY  2001   [place]  2005   postpartum   Patient Active Problem List   Diagnosis Date Noted   Breast mass, right 03/12/2013   REFERRING PROVIDER: Viona Gilmore NP.  REFERRING DIAG: Cervical Spondylosis.  THERAPY DIAG:  Cervicalgia  Abnormal posture  Rationale for Evaluation and Treatment: Rehabilitation  ONSET DATE: Ongoing.  SUBJECTIVE:                                                                                                                                                                                                         SUBJECTIVE STATEMENT:  Pain low.  Been of work.  PERTINENT HISTORY:  Mandible surgery, right biceps repair.     Cervical bone spurs  PAIN:  Are you having pain? Yes: NPRS scale: .5/10 Pain location: Left  cervical. Pain description: Ache. Aggravating factors: Turning head, jarring motions. Relieving factors: Rest.  PRECAUTIONS: None  NEXT MD VISIT:   OBJECTIVE:   PATIENT SURVEYS:  FOTO Complete.  TODAY'S TREATMENT:  DATE:                                                             07/09/22: Modalities  Combo e'stim/US at 1.50 W/CM2 x 12 minutes over patient's left UT   STW/M x 11 minutes with ischemic release technique utilized   IFC at 80-150 Hz on 40% scan x 15 minutes.  Manual Therapy    PATIENT EDUCATION:  Education details: Towel cervical rotation stretch Person educated: Patient Education method: Explanation, Demonstration, and Handouts Education comprehension: verbalized understanding and returned demonstration  HOME EXERCISE PROGRAM: HOME EXERCISE PROGRAM Created by [place] Clois Montavon Dec 19th, 2023 View at [email or web address] using code: [email or web address]  Page 1 of 1  1 Exercise SNAG Using a towel draped around your neck, begin with one hand stabilizing one end of towel with light downward pull. Next, turn your head as far to one direction as able (as in picture). Use opposite hand to gently pull towel across your face making sure not to pull across your jaw to allow for light stretch into rotational direction. Repeat 5 Times Hold 5 Seconds Complete 1 Set Perform 3 Times  ASSESSMENT:  CLINICAL IMPRESSION: Pain very low today at least in part attributed to being on vacation. No pain reported after treatment today. OBJECTIVE IMPAIRMENTS: decreased activity tolerance, increased muscle spasms, postural dysfunction, and pain.   ACTIVITY LIMITATIONS: lifting  PARTICIPATION LIMITATIONS: yard work  PERSONAL FACTORS: Time since onset of injury/illness/exacerbation are also affecting patient's functional outcome.   REHAB POTENTIAL:  Excellent  CLINICAL DECISION MAKING: Stable/uncomplicated  EVALUATION COMPLEXITY: Low   GOALS: LONG TERM GOALS: Target date: 08/08/22  Ind with a HEP. Baseline:  Goal status: INITIAL  2.  Improve bilateral active cervical rotation to 65 degrees+ for greater ease of turning head while driving. Baseline:  Goal status: INITIAL  3.  Perform ADL's with average pain-level not exceeding a 2-3/10. Baseline:  Goal status: INITIAL   PLAN:  PT FREQUENCY: 1-2x/week  PT DURATION: other: 8 visits.  PLANNED INTERVENTIONS: Therapeutic exercises, Therapeutic activity, Patient/Family education, Self Care, Dry Needling, Electrical stimulation, Cryotherapy, Moist heat, Traction, Ultrasound, and Manual therapy  PLAN FOR NEXT SESSION: Modalities and STW/M as needed,  chin tucks, towel assisted cervical extension.   Florene Brill, [place], PT 07/09/2022, 4:14 PM

## 2022-12-26 LAB — LIPID PANEL
Cholesterol: 185 (ref 0–200)
HDL: 41 (ref 35–70)
LDL Cholesterol: 113
LDl/HDL Ratio: 2.8
Triglycerides: 179 — AB (ref 40–160)

## 2022-12-27 LAB — HM PAP SMEAR: HPV, high-risk: NEGATIVE

## 2023-05-30 ENCOUNTER — Other Ambulatory Visit (HOSPITAL_COMMUNITY): Payer: Self-pay | Admitting: Obstetrics and Gynecology

## 2023-05-30 DIAGNOSIS — Z1231 Encounter for screening mammogram for malignant neoplasm of breast: Secondary | ICD-10-CM

## 2023-06-09 ENCOUNTER — Encounter (HOSPITAL_COMMUNITY): Payer: Self-pay | Admitting: Radiology

## 2023-06-09 ENCOUNTER — Ambulatory Visit (HOSPITAL_COMMUNITY)
Admission: RE | Admit: 2023-06-09 | Discharge: 2023-06-09 | Disposition: A | Discharge status: Home / Self Care | Payer: 59 | Source: Ambulatory Visit | Attending: Obstetrics and Gynecology | Admitting: Obstetrics and Gynecology

## 2023-06-09 DIAGNOSIS — Z1231 Encounter for screening mammogram for malignant neoplasm of breast: Secondary | ICD-10-CM | POA: Insufficient documentation

## 2024-01-09 LAB — LAB REPORT - SCANNED
A1c: 5.5
EGFR: 94

## 2024-01-20 ENCOUNTER — Encounter (HOSPITAL_BASED_OUTPATIENT_CLINIC_OR_DEPARTMENT_OTHER): Payer: Self-pay | Admitting: Emergency Medicine

## 2024-01-20 ENCOUNTER — Other Ambulatory Visit: Payer: Self-pay

## 2024-01-20 ENCOUNTER — Emergency Department (HOSPITAL_BASED_OUTPATIENT_CLINIC_OR_DEPARTMENT_OTHER)

## 2024-01-20 ENCOUNTER — Other Ambulatory Visit (HOSPITAL_BASED_OUTPATIENT_CLINIC_OR_DEPARTMENT_OTHER): Payer: Self-pay

## 2024-01-20 ENCOUNTER — Emergency Department (HOSPITAL_BASED_OUTPATIENT_CLINIC_OR_DEPARTMENT_OTHER)
Admission: EM | Admit: 2024-01-20 | Discharge: 2024-01-20 | Disposition: A | Discharge status: Home / Self Care | Source: Ambulatory Visit | Attending: Emergency Medicine | Admitting: Emergency Medicine

## 2024-01-20 DIAGNOSIS — D72829 Elevated white blood cell count, unspecified: Secondary | ICD-10-CM | POA: Insufficient documentation

## 2024-01-20 DIAGNOSIS — R1032 Left lower quadrant pain: Secondary | ICD-10-CM | POA: Diagnosis present

## 2024-01-20 DIAGNOSIS — K5732 Diverticulitis of large intestine without perforation or abscess without bleeding: Secondary | ICD-10-CM | POA: Diagnosis not present

## 2024-01-20 LAB — CBC
HCT: 39.7 % (ref 36.0–46.0)
Hemoglobin: 13.6 g/dL (ref 12.0–15.0)
MCH: 30.1 pg (ref 26.0–34.0)
MCHC: 34.3 g/dL (ref 30.0–36.0)
MCV: 87.8 fL (ref 80.0–100.0)
Platelets: 303 K/uL (ref 150–400)
RBC: 4.52 MIL/uL (ref 3.87–5.11)
RDW: 11.7 % (ref 11.5–15.5)
WBC: 15.9 K/uL — ABNORMAL HIGH (ref 4.0–10.5)
nRBC: 0 % (ref 0.0–0.2)

## 2024-01-20 LAB — COMPREHENSIVE METABOLIC PANEL WITH GFR
ALT: 15 U/L (ref 0–44)
AST: 26 U/L (ref 15–41)
Albumin: 4.2 g/dL (ref 3.5–5.0)
Alkaline Phosphatase: 71 U/L (ref 38–126)
Anion gap: 14 (ref 5–15)
BUN: 8 mg/dL (ref 6–20)
CO2: 22 mmol/L (ref 22–32)
Calcium: 9 mg/dL (ref 8.9–10.3)
Chloride: 101 mmol/L (ref 98–111)
Creatinine, Ser: 0.67 mg/dL (ref 0.44–1.00)
GFR, Estimated: >60 mL/min (ref >60)
Glucose, Bld: 107 mg/dL — ABNORMAL HIGH (ref 70–99)
Potassium: 4.6 mmol/L (ref 3.5–5.1)
Sodium: 137 mmol/L (ref 135–145)
Total Bilirubin: 0.5 mg/dL (ref 0.0–1.2)
Total Protein: 7.7 g/dL (ref 6.5–8.1)

## 2024-01-20 LAB — URINALYSIS, ROUTINE W REFLEX MICROSCOPIC
Bilirubin Urine: NEGATIVE
Glucose, UA: NEGATIVE mg/dL
Ketones, ur: NEGATIVE mg/dL
Leukocytes,Ua: NEGATIVE
Nitrite: NEGATIVE
Protein, ur: NEGATIVE mg/dL
Specific Gravity, Urine: <1.005 — ABNORMAL LOW (ref 1.005–1.030)
pH: 7 (ref 5.0–8.0)

## 2024-01-20 LAB — LACTIC ACID, PLASMA: Lactic Acid, Venous: 0.9 mmol/L (ref 0.5–1.9)

## 2024-01-20 LAB — LIPASE, BLOOD: Lipase: 15 U/L (ref 11–51)

## 2024-01-20 MED ORDER — LACTATED RINGERS IV BOLUS
1000.0000 mL | Freq: Once | INTRAVENOUS | Status: AC
  Administered 2024-01-20: 1000 mL via INTRAVENOUS

## 2024-01-20 MED ORDER — IOHEXOL 300 MG/ML  SOLN
100.0000 mL | Freq: Once | INTRAMUSCULAR | Status: AC | PRN
  Administered 2024-01-20: 80 mL via INTRAVENOUS

## 2024-01-20 MED ORDER — CIPROFLOXACIN HCL 500 MG PO TABS
500.0000 mg | ORAL_TABLET | Freq: Two times a day (BID) | ORAL | 0 refills | Status: AC
Start: 2024-01-20 — End: 2024-01-27
  Filled 2024-01-20: qty 14, 7d supply, fill #0

## 2024-01-20 MED ORDER — METRONIDAZOLE 500 MG PO TABS
500.0000 mg | ORAL_TABLET | Freq: Two times a day (BID) | ORAL | 0 refills | Status: DC
  Filled 2024-01-20: qty 14, 7d supply, fill #0

## 2024-01-20 MED ORDER — OXYCODONE HCL 5 MG PO TABS
5.0000 mg | ORAL_TABLET | ORAL | 0 refills | Status: DC | PRN
  Filled 2024-01-20: qty 7, 2d supply, fill #0

## 2024-01-20 MED ORDER — MORPHINE SULFATE (PF) 4 MG/ML IV SOLN
4.0000 mg | Freq: Once | INTRAVENOUS | Status: DC
  Filled 2024-01-20: qty 1

## 2024-01-20 MED ORDER — ONDANSETRON 4 MG PO TBDP
4.0000 mg | ORAL_TABLET | Freq: Three times a day (TID) | ORAL | 0 refills | Status: DC | PRN
Start: 2024-01-20 — End: 2024-02-06
  Filled 2024-01-20: qty 10, 4d supply, fill #0

## 2024-01-20 MED ORDER — ONDANSETRON HCL 4 MG/2ML IJ SOLN
4.0000 mg | Freq: Once | INTRAMUSCULAR | Status: DC
  Filled 2024-01-20: qty 2

## 2024-01-20 NOTE — ED Triage Notes (Signed)
 Pt caox4 c/o LLQ abd pain and L flank pain Thursday , pain on urination started this morning. LBM this morning, normal, denies N/V/D.

## 2024-01-20 NOTE — ED Provider Notes (Signed)
 [place] EMERGENCY DEPARTMENT AT [facility] Provider Note   CSN: [id number] Arrival date & time: 01/20/24  9068     Patient presents with: Flank Pain   Erin [name] is a 53 y.o. female reportedly otherwise healthy presents emerged from today for evaluation of left lower quadrant pain since Thursday.  Reports that it did worsen since Sunday/Monday.  Reports that it is crampy in nature.  7 symptom has been as.  Having some nausea but no vomiting.  Only having nausea with her worsening pain.  No diarrhea or constipation.  Had a normal bowel movement today without any melena or hematochezia.  No fever.  Did have 1 episode of dysuria this morning but has not had any dysuria since.  No hematuria or urgency or frequency.  She had colonoscopy in 2021 and reports it was normal does not have a next medical 10 years.  Abdominal surgery include bilateral tubal ligation.  Allergic to penicillin.  Denies any tobacco, EtOH, or illicit drug use.    Flank Pain Associated symptoms include abdominal pain. Pertinent negatives include no chest pain and no shortness of breath.       Prior to Admission medications   Medication Sig Start Date End Date Taking? Authorizing Provider  methylPREDNISolone (MEDROL DOSEPAK) 4 MG TBPK tablet Follow package directions Patient not taking: Reported on 05/23/2021 05/21/19   [provider]  tiZANidine (ZANAFLEX) 4 MG tablet Take 1/2 to 1 tablet at bedtime as needed for spasm Patient not taking: Reported on 05/23/2021 05/21/19   [provider]    Allergies: Penicillins    Review of Systems  Constitutional:  Negative for chills and fever.  Respiratory:  Negative for shortness of breath.   Cardiovascular:  Negative for chest pain.  Gastrointestinal:  Positive for abdominal pain. Negative for constipation, diarrhea, nausea and vomiting.  Genitourinary:  Positive for flank pain.    Updated Vital Signs BP 136/63   Pulse (!) 113   Temp (!)  97 F (36.1 C)   Resp 16   LMP 07/13/2018   SpO2 100%   Physical Exam Vitals and nursing note reviewed.  Constitutional:      General: She is not in acute distress.    Appearance: She is not toxic-appearing.  HENT:     Mouth/Throat:     Mouth: Mucous membranes are moist.  Eyes:     General: No scleral icterus. Cardiovascular:     Rate and Rhythm: Tachycardia present.     Pulses:          Radial pulses are 2+ on the right side and 2+ on the left side.       Dorsalis pedis pulses are 2+ on the right side and 2+ on the left side.       Posterior tibial pulses are 2+ on the right side and 2+ on the left side.  Pulmonary:     Effort: Pulmonary effort is normal. No respiratory distress.  Abdominal:     Palpations: Abdomen is soft.     Tenderness: There is abdominal tenderness. There is no guarding or rebound.     Comments: Diffuse abdominal tenderness mainly to the left lower quadrant.  Soft.  No guarding or rebound.  Normal active bowel sounds.  Musculoskeletal:     Right lower leg: No edema.     Left lower leg: No edema.  Skin:    General: Skin is warm and dry.  Neurological:     Mental Status: She  is alert.     (all labs ordered are listed, but only abnormal results are displayed) Labs Reviewed  COMPREHENSIVE METABOLIC PANEL WITH GFR - Abnormal; Notable for the following components:      Result Value   Glucose, Bld 107 (*)    All other components within normal limits  CBC - Abnormal; Notable for the following components:   WBC 15.9 (*)    All other components within normal limits  URINALYSIS, ROUTINE W REFLEX MICROSCOPIC - Abnormal; Notable for the following components:   Color, Urine COLORLESS (*)    Specific Gravity, Urine <1.005 (*)    Hgb urine dipstick SMALL (*)    Bacteria, UA RARE (*)    All other components within normal limits  CULTURE, BLOOD (SINGLE)  URINE CULTURE  CULTURE, BLOOD (SINGLE)  LIPASE, BLOOD  LACTIC ACID, PLASMA     EKG: None  Radiology: CT ABDOMEN PELVIS W CONTRAST Result Date: 01/20/2024 CLINICAL DATA:  Left lower quadrant abdominal pain and flank pain EXAM: CT ABDOMEN AND PELVIS WITH CONTRAST TECHNIQUE: Multidetector CT imaging of the abdomen and pelvis was performed using the standard protocol following bolus administration of intravenous contrast. RADIATION DOSE REDUCTION: This exam was performed according to the departmental dose-optimization program which includes automated exposure control, adjustment of the mA and/or kV according to patient size and/or use of iterative reconstruction technique. CONTRAST:  80mL OMNIPAQUE  IOHEXOL  300 MG/ML  SOLN COMPARISON:  None Available. FINDINGS: Lower chest: No significant abnormality Hepatobiliary: No liver lesions. No intrahepatic biliary dilatation. Gallbladder: Normal Pancreas: Normal Spleen: Normal Adrenal glands: Normal. Kidneys: Normal Stomach/duodenum: Normal Small & large bowel: There is acute sigmoid diverticulitis. The appendix is normal Vascular/Lymphatic: No significant vascular findings are present. No enlarged abdominal or pelvic lymph nodes. Pelvis: No abnormal mass, adenopathy or inflammatory process. The uterus and ovaries appear normal. Other: None Musculoskeletal: No bone lesion, fracture or other significant abnormality IMPRESSION: Acute sigmoid diverticulitis.  No abscess. Electronically Signed   By: Nancyann Burns M.D.   On: 01/20/2024 12:34   Procedures   Medications Ordered in the ED  morphine  (PF) 4 MG/ML injection 4 mg (has no administration in time range)  ondansetron  (ZOFRAN ) injection 4 mg (has no administration in time range)  lactated ringers  bolus 1,000 mL (1,000 mLs Intravenous New Bag/Given 01/20/24 1029)  iohexol  (OMNIPAQUE ) 300 MG/ML solution 100 mL (80 mLs Intravenous Contrast Given 01/20/24 1132)    Medical Decision Making Amount and/or Complexity of Data Reviewed Labs: ordered. Radiology: ordered.  Risk Prescription  drug management.   53 y.o. female presents to the ER for evaluation of left lower quadrant pain. Differential diagnosis includes but is not limited to AAA, mesenteric ischemia, appendicitis, diverticulitis, DKA, gastroenteritis, nephrolithiasis, pancreatitis, constipation, UTI, bowel obstruction, biliary disease, IBD, PUD, hepatitis, ectopic pregnancy, ovarian torsion, PID. Vital signs temperature 97, tachycardia at 130, blood pressure 129/81, otherwise unremarkable. Physical exam as noted above.   She does have some tenderness to the left lower quadrant.  Question diverticulitis.  Will order labs and imaging.  Fluids ordered as well as pain medication and Zofran .  I independently reviewed and interpreted the patient's labs.  CBC shows white count of 15.9.  No anemia.  CMP shows Leukos at 107, otherwise no electrolyte or LFT abnormality.  Lipase within normal limits.  Urinalysis shows colorless urine but is dilute.  Small amount of hemoglobin present with rare bacteria.  Will culture given episode of dysuria however now not having any.  Lactic acid within normal limits  at 0.9.  Blood cultures are pending.   CT Abd shows Acute sigmoid diverticulitis.  No abscess. Per radiologist's interpretation.    Per nursing, patient has refused pain medication.  Heart rate has improved some to 130 to 116.  She is not have any chest pain or shortness of breath.  I do feel that this is pain related and my attending agrees.  Does not feel any additional workup needs to be performed patient can be discharged home with return precautions.  Patient is allergic to penicillins, have ordered her Cipro  Flagyl .  I have given her some narcotic pain medication to go home with.  She has follow-up with [facility] already and encouraged to call to schedule.  She is stable for discharge home with strict return precautions.  We discussed the results of the labs/imaging. The plan is take antibiotics We discussed strict return precautions  and red flag symptoms. The patient verbalized their understanding and agrees to the plan. The patient is stable and being discharged home in good condition.  I discussed this case with my attending physician who cosigned this note including patient's presenting symptoms, physical exam, and planned diagnostics and interventions. Attending physician stated agreement with plan or made changes to plan which were implemented.   Attending physician assessed patient at bedside.  Portions of this report may have been transcribed using voice recognition software. Every effort was made to ensure accuracy; however, inadvertent computerized transcription errors may be present.    Final diagnoses:  Diverticulitis of sigmoid colon    ED Discharge Orders          Ordered    ciprofloxacin  (CIPRO ) 500 MG tablet  Every 12 hours        01/20/24 1338    metroNIDAZOLE  (FLAGYL ) 500 MG tablet  2 times daily        01/20/24 1338    ondansetron  (ZOFRAN -ODT) 4 MG disintegrating tablet  Every 8 hours PRN        01/20/24 1338    oxyCODONE  (ROXICODONE ) 5 MG immediate release tablet  Every 4 hours PRN        01/20/24 1338               Bernis Ernst, PA-C 01/21/24 2315    Dasie Faden, MD 01/24/24 [phone]

## 2024-01-20 NOTE — ED Provider Notes (Signed)
 I provided a substantive portion of the care of this patient.  I personally made/approved the management plan for this patient and take responsibility for the patient management.      53 year old female presents with left lower quadrant pain times several days.  CBC shows a mild leukocytosis.  Physical exam, she is very tender left lower quadrant.  Abdominal CT pending at this time   Dasie Faden, MD 01/20/24 1234

## 2024-01-20 NOTE — Discharge Instructions (Addendum)
 You were seen in the emerged from today for evaluation of your abdominal pain.  Your CT scan shows that you have diverticulitis.  I have included more information on diverticulitis into the discharge paperwork for you to review.  For this, you will need to be on 2 medications/antibiotics.  You will need to take these twice daily for the next week.  Please take the entirety of the course and do not miss any doses.  I am also sending you in some as needed medication.  Zofran  you can take as needed for nausea.  I am also sending you home some narcotic pain medication that she can take for breakthrough pain.  Please do not drive or operate any heavy machinery while on this medication as it will make you sleepy.  Is important for you to follow-up with Dr. Dolan office to schedule follow-up appointment.  If you have any concerns, new or worsening symptoms, please return to your nearest emergency department for reevaluation.  Contact a doctor if: Your pain gets worse. You are not pooping like normal. Your symptoms do not get better. Your symptoms get worse very fast. You have a fever. You vomit more than one time. You have poop that is: Bloody. Black. Tarry.

## 2024-01-21 LAB — URINE CULTURE: Culture: <10000 — AB

## 2024-01-25 LAB — CULTURE, BLOOD (SINGLE)
Culture: NO GROWTH
Culture: NO GROWTH
Special Requests: ADEQUATE
Special Requests: ADEQUATE

## 2024-02-02 ENCOUNTER — Encounter: Payer: Self-pay | Admitting: Student in an Organized Health Care Education/Training Program

## 2024-02-06 ENCOUNTER — Encounter: Payer: Self-pay | Admitting: Student in an Organized Health Care Education/Training Program

## 2024-02-06 ENCOUNTER — Ambulatory Visit: Attending: Student in an Organized Health Care Education/Training Program

## 2024-02-06 ENCOUNTER — Ambulatory Visit (INDEPENDENT_AMBULATORY_CARE_PROVIDER_SITE_OTHER): Admitting: Student in an Organized Health Care Education/Training Program

## 2024-02-06 VITALS — BP 129/60 | HR 83 | Ht 61.0 in | Wt 162.0 lb

## 2024-02-06 DIAGNOSIS — G4733 Obstructive sleep apnea (adult) (pediatric): Secondary | ICD-10-CM | POA: Insufficient documentation

## 2024-02-06 DIAGNOSIS — Z780 Asymptomatic menopausal state: Secondary | ICD-10-CM | POA: Insufficient documentation

## 2024-02-06 DIAGNOSIS — Z0000 Encounter for general adult medical examination without abnormal findings: Secondary | ICD-10-CM | POA: Diagnosis not present

## 2024-02-06 DIAGNOSIS — K5790 Diverticulosis of intestine, part unspecified, without perforation or abscess without bleeding: Secondary | ICD-10-CM | POA: Insufficient documentation

## 2024-02-06 DIAGNOSIS — F419 Anxiety disorder, unspecified: Secondary | ICD-10-CM | POA: Insufficient documentation

## 2024-02-06 DIAGNOSIS — E66811 Obesity, class 1: Secondary | ICD-10-CM | POA: Insufficient documentation

## 2024-02-06 DIAGNOSIS — I480 Paroxysmal atrial fibrillation: Secondary | ICD-10-CM | POA: Insufficient documentation

## 2024-02-06 NOTE — Assessment & Plan Note (Signed)
 Recent diagnosis of diverticulitis on CT.  She has recovered well after antibiotics.  Last colonoscopy she reports was around 2021.  We talked about stool softeners and lifestyle modifications to lower the risk of worsening diverticular disease.

## 2024-02-06 NOTE — Assessment & Plan Note (Signed)
 Chronic and stable.  On hormone replacement therapy with oral estrogen and progesterone for about 3 years.  This is prescribed by her gynecologist.  She is considering trying to stop this medication sometime this winter.  No history of ischemic events.

## 2024-02-06 NOTE — Progress Notes (Signed)
 Complete physical exam  Patient: Erin [name]   DOB: 1970-08-06   53 y.o. Female  MRN: [id number]  Subjective:    Chief Complaint  Patient presents with   Establish Care    Patient takes 500mg /units of Vitamin D one a day until October     Erin [name] is a 53 y.o. female who presents today for a complete physical exam. She reports consuming a general diet. The patient does not participate in regular exercise at present. She generally feels well. She reports sleeping well. She does not have additional problems to discuss today.   Discussed the use of AI scribe software for clinical note transcription with the patient, who gave verbal consent to proceed.  History of Present Illness Erin [name] is a 53 year old female who presents for establishment of care and follow-up on diverticulitis.  She was diagnosed with diverticulitis a couple of weeks ago and treated with antibiotics for a week. The pain has subsided, but she continues to experience gassiness. She visited the ER due to severe pain and was given IV fluids and antibiotics. Her heart rate was noted to be high during the ER visit.  She has a history of degenerative disc disease, which only flares up occasionally. She has not had any recent monitoring of her blood pressure, which was previously managed by a physician who has since relocated.  She experiences episodes of anxiety, sometimes feeling her heart rate increase, and has been informed by her watch of a possible AFib episode several months ago. She does not exercise regularly and acknowledges being out of shape. 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  She has been on hormone replacement therapy for menopause for a couple of years, initially using a patch a also thank you nd progesterone pill, but now on a dual oral hormone due to supply issues with the patch.  She reports snoring and feeling tired during the day, attributing it  to insufficient sleep, usually getting five to six hours per night. She has not undergone a sleep study but acknowledges her husband's observations of her snoring.  Her past surgical history includes jaw surgery at age 71, tubal ligation, and removal of benign breast cysts. She had a colonoscopy in 2021, which was clear, but anticipates another due to her recent diverticulitis diagnosis.  She follows a heart-healthy diet, cooking at home most nights and limiting red meat and fried foods. She has a history of hemorrhoids, which have been managed conservatively without surgery.    Most recent fall risk assessment:    02/06/2024    8:59 AM  Fall Risk   Falls in the past year? 0  Number falls in past yr: 0  Injury with Fall? 0  Risk for fall due to : No Fall Risks  Follow up Falls evaluation completed     Most recent depression screenings:    02/06/2024    8:59 AM  PHQ 2/9 Scores  PHQ - 2 Score 0  PHQ- 9 Score 2     Patient Care Team: Jerrell Cleatus Ned, MD as PCP - General (Internal Medicine)   Outpatient Medications Prior to Visit  Medication Sig   ALPRAZolam  (XANAX ) 0.25 MG tablet Take 0.25 mg by mouth daily as needed.   estradiol-norethindrone (ACTIVELLA) 1-0.5 MG tablet Take 1 tablet by mouth daily.   [DISCONTINUED] estradiol (VIVELLE-DOT) 0.05 MG/24HR patch Place 1 patch onto the skin 2 (two) times a  week.   [DISCONTINUED] ipratropium (ATROVENT) 0.06 % nasal spray Place 2 sprays into the nose 3 (three) times daily.   [DISCONTINUED] omeprazole (PRILOSEC) 40 MG capsule Take 40 mg by mouth daily.   [DISCONTINUED] progesterone (FIRST-PROGESTERONE VGS) 200 MG SUPP Place 200 mg vaginally.   [DISCONTINUED] methylPREDNISolone (MEDROL DOSEPAK) 4 MG TBPK tablet Follow package directions (Patient not taking: Reported on 05/23/2021)   [DISCONTINUED] metroNIDAZOLE  (FLAGYL ) 500 MG tablet Take 1 tablet (500 mg total) by mouth 2 (two) times daily.   [DISCONTINUED] ondansetron   (ZOFRAN -ODT) 4 MG disintegrating tablet Take 1 tablet (4 mg total) by mouth every 8 (eight) hours as needed for nausea or vomiting.   [DISCONTINUED] oxyCODONE  (ROXICODONE ) 5 MG immediate release tablet Take 1 tablet (5 mg total) by mouth every 4 (four) hours as needed for severe pain (pain score 7-10).   [DISCONTINUED] progesterone (PROMETRIUM) 200 MG capsule Take 200 mg by mouth daily.   No facility-administered medications prior to visit.       Objective:     BP 129/60   Pulse 83   Ht 5' 1 (1.549 m)   Wt 162 lb (73.5 kg)   LMP 07/13/2018   SpO2 100%   BMI 30.61 kg/m    Physical Exam   Gen: Well-appearing woman Eyes: Normal Ears: Normal tympanic membranes bilaterally Mouth: Normal dentition, small bony overgrowth in the upper hard palate Neck: Normal thyroid, slightly increased circumference, no nodules or adenopathy Heart: Regular, no murmur Lungs: Unlabored, clear throughout Abd: Soft, nontender, no organomegaly Ext: Warm, no edema, normal joints Neuro: Alert, conversational, full strength upper and lower extremities, normal get up and go, normal balance Psych: Appropriate mood and affect, not anxious or depressed.  EKG: Obtained due to recent episodes of palpitations and possible atrial fibrillation.  EKG is normal sinus rhythm, right axis deviation, normal intervals, no Q waves or other ST changes.   Results for orders placed or performed in visit on 02/06/24  Lab report - scanned  Result Value Ref Range   EGFR 94.0    A1c 5.5        Assessment & Plan:    Routine Health Maintenance and Physical Exam  Immunization History  Administered Date(s) Administered   Influenza, Quadrivalent, Recombinant, Inj, Pf 04/29/2018, 05/08/2019   Influenza-Unspecified 06/10/2020, 05/12/2021   Tdap 07/08/2016, 07/08/2018   Unspecified SARS-COV-2 Vaccination 09/30/2019, 10/23/2019, 06/10/2020    Health Maintenance  Topic Date Due   HIV Screening  Never done   Hepatitis C  Screening  Never done   Hepatitis B Vaccines (1 of 3 - 19+ 3-dose series) Never done   Colonoscopy  Never done   Pneumococcal Vaccine: 50+ Years (1 of 1 - PCV) Never done   Zoster Vaccines- Shingrix (1 of 2) Never done   Cervical Cancer Screening (HPV/Pap Cotest)  10/10/2021   COVID-19 Vaccine (4 - 2024-25 season) 03/09/2023   INFLUENZA VACCINE  02/06/2024   MAMMOGRAM  06/08/2025   DTaP/Tdap/Td (3 - Td or Tdap) 07/08/2028   HPV VACCINES  Aged Out   Meningococcal B Vaccine  Aged Out    Discussed health benefits of physical activity, and encouraged her to engage in regular exercise appropriate for her age and condition.  Problem List Items Addressed This Visit       High   Paroxysmal atrial fibrillation (HCC) (Chronic)   At high risk for paroxysmal atrial fibrillation.  Apple Watch has told her she has had A-fib at least 1 time.  She has a sensation  of palpitations especially with exertion.  EKG in the office is normal sinus rhythm.  She has risk factors for A-fib including obesity, age, and I think she has untreated sleep apnea.  Will order an ambulatory monitor to further evaluate.      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  Weight today 162 pounds with a BMI of 30.  She has been working on diet interventions without much success.      Obstructive sleep apnea (Chronic)   High risk for undiagnosed sleep apnea.  She has mild obesity, neck circumference is borderline right at 39 cm, she snores, daytime drowsiness.  STOP-BANG score is 3 which is high risk for sleep apnea.  She is also having episodes suspicious for A-fib.  Will order a home sleep study.      Relevant Orders   Home sleep test     Medium    Menopause (Chronic)   Chronic and stable.  On hormone replacement therapy with oral estrogen and progesterone for about 3 years.  This is prescribed by her gynecologist.  She is considering  trying to stop this medication sometime this winter.  No history of ischemic events.      Anxious mood (Chronic)   Mild intermittent anxious mood.  Not currently using daily medications.  Has used Xanax  as needed for times of high stress and anxiety.  This is prescribed by her gynecologist.  Last year 30 tablets lasted her about 1 year.      Relevant Medications   ALPRAZolam  (XANAX ) 0.25 MG tablet     Low   Health maintenance examination - Primary (Chronic)   Diverticulosis (Chronic)   Recent diagnosis of diverticulitis on CT.  She has recovered well after antibiotics.  Last colonoscopy she reports was around 2021.  We talked about stool softeners and lifestyle modifications to lower the risk of worsening diverticular disease.      Return in about 4 months (around 06/07/2024).     Cleatus Debby Specking, MD

## 2024-02-06 NOTE — Assessment & Plan Note (Signed)
 Mild intermittent anxious mood.  Not currently using daily medications.  Has used Xanax  as needed for times of high stress and anxiety.  This is prescribed by her gynecologist.  Last year 30 tablets lasted her about 1 year.

## 2024-02-06 NOTE — Assessment & Plan Note (Signed)
 At high risk for paroxysmal atrial fibrillation.  Apple Watch has told her she has had A-fib at least 1 time.  She has a sensation of palpitations especially with exertion.  EKG in the office is normal sinus rhythm.  She has risk factors for A-fib including obesity, age, and I think she has untreated sleep apnea.  Will order an ambulatory monitor to further evaluate.

## 2024-02-06 NOTE — Patient Instructions (Signed)
  VISIT SUMMARY: Today, we discussed your recent health concerns and established a plan for your ongoing care. We reviewed your history of diverticulitis, suspected atrial fibrillation, possible sleep apnea, menopausal symptoms, and weight management.  YOUR PLAN: -ATRIAL FIBRILLATION, PAROXYSMAL (SUSPECTED): Atrial fibrillation is an irregular and often rapid heart rate that can increase the risk of strokes and other heart-related complications. Although your EKG did not show AFib, we will use a Zio patch for continuous heart monitoring to check for any irregularities.  -OBSTRUCTIVE SLEEP APNEA (SUSPECTED): Obstructive sleep apnea is a condition where breathing repeatedly stops and starts during sleep, leading to poor sleep quality and increased health risks. We will conduct a home sleep study to determine if you have this condition.  -DIVERTICULITIS, RECENT EPISODE: Diverticulitis is an inflammation or infection of small pouches that can form in your intestines. Your recent episode was treated with antibiotics, and while your symptoms have improved, we recommend following up with a gastroenterologist for further evaluation.  -MENOPAUSAL SYNDROME ON HORMONE REPLACEMENT THERAPY: Menopausal syndrome includes symptoms like hot flashes and mood changes due to hormonal changes. You are currently on hormone replacement therapy and plan to attempt discontinuation in the winter as advised by your gynecologist. Continue with your current therapy until then.  -OBESITY: Obesity is a condition characterized by excessive body fat. Despite following a heart-healthy diet, incorporating regular exercise can help with weight management. We encourage you to start a regular exercise routine.  INSTRUCTIONS: 1. Use the Zio patch as directed for continuous heart monitoring. 2. Complete the home sleep study to evaluate for obstructive sleep apnea. 3. Follow up with a gastroenterologist for your recent diverticulitis  episode. 4. Continue your current hormone replacement therapy and plan to attempt discontinuation in the winter. 5. Start a regular exercise routine to aid in weight management.

## 2024-02-06 NOTE — Progress Notes (Unsigned)
 EP to read.

## 2024-02-06 NOTE — Assessment & Plan Note (Signed)
 Chronic and stable.  Weight today 162 pounds with a BMI of 30.  She has been working on diet interventions without much success.

## 2024-02-06 NOTE — Assessment & Plan Note (Signed)
 High risk for undiagnosed sleep apnea.  She has mild obesity, neck circumference is borderline right at 39 cm, she snores, daytime drowsiness.  STOP-BANG score is 3 which is high risk for sleep apnea.  She is also having episodes suspicious for A-fib.  Will order a home sleep study.

## 2024-03-10 DIAGNOSIS — I480 Paroxysmal atrial fibrillation: Secondary | ICD-10-CM

## 2024-03-11 ENCOUNTER — Ambulatory Visit: Payer: Self-pay | Admitting: Student in an Organized Health Care Education/Training Program

## 2024-03-11 DIAGNOSIS — I471 Supraventricular tachycardia, unspecified: Secondary | ICD-10-CM

## 2024-03-11 NOTE — Telephone Encounter (Signed)
 Dr. Jerrell,  I had Burnard help me with this to be sure we had it done correctly. The reason no one had contacted the patient is because the facility was not electronically chosen in the order. A facility has to be chosen for it to fall into a workqueue to prompt someone to contact the patient. Burnard showed me that it was written on the original order but doing it that way doesn't send it anywhere. Any CMA would need to know that order was placed and they would need to print that order out and fax it when it's done that way. Either works, but that's why it hasn't been worked on.

## 2024-03-11 NOTE — Telephone Encounter (Signed)
 I see that sleep study was ordered 02/06/24. Do you know anything, Erin [name]?

## 2024-03-11 NOTE — Addendum Note (Signed)
 Addended by: Noelle Hoogland A on: 03/11/2024 12:04 PM   Modules accepted: Orders

## 2024-03-11 NOTE — Telephone Encounter (Signed)
 Home sleep test is not a referral. Needs to be scheduled with the sleep lab at [facility] from my order. Thanks.

## 2024-03-11 NOTE — Telephone Encounter (Signed)
 Please see patient's response

## 2024-03-23 ENCOUNTER — Encounter (HOSPITAL_COMMUNITY): Payer: Self-pay

## 2024-03-23 ENCOUNTER — Ambulatory Visit (HOSPITAL_COMMUNITY)

## 2024-04-26 ENCOUNTER — Ambulatory Visit (HOSPITAL_COMMUNITY)
Admission: RE | Admit: 2024-04-26 | Discharge: 2024-04-26 | Disposition: A | Discharge status: Home / Self Care | Source: Ambulatory Visit | Attending: Student in an Organized Health Care Education/Training Program | Admitting: Student in an Organized Health Care Education/Training Program

## 2024-04-26 DIAGNOSIS — R9431 Abnormal electrocardiogram [ECG] [EKG]: Secondary | ICD-10-CM | POA: Diagnosis not present

## 2024-04-26 DIAGNOSIS — I471 Supraventricular tachycardia, unspecified: Secondary | ICD-10-CM | POA: Diagnosis present

## 2024-04-26 LAB — ECHOCARDIOGRAM COMPLETE
Area-P 1/2: 3.53 cm2
S' Lateral: 2.4 cm

## 2024-04-26 NOTE — Progress Notes (Signed)
*  PRELIMINARY RESULTS* Echocardiogram 2D Echocardiogram has been performed.  Erin [name] 04/26/2024, 9:24 AM

## 2024-04-27 ENCOUNTER — Ambulatory Visit: Payer: Self-pay | Admitting: Student in an Organized Health Care Education/Training Program

## 2024-04-27 ENCOUNTER — Ambulatory Visit (HOSPITAL_BASED_OUTPATIENT_CLINIC_OR_DEPARTMENT_OTHER): Attending: Student in an Organized Health Care Education/Training Program | Admitting: Internal Medicine

## 2024-04-27 DIAGNOSIS — G4733 Obstructive sleep apnea (adult) (pediatric): Secondary | ICD-10-CM

## 2024-05-09 NOTE — Procedures (Signed)
 [facility] [address] [place], [place] [address] Tel: [phone]   Fax: [phone]  Home Sleep Test Interpretation  Patient Name: Erin [name], Erin [name] Date: 04/27/2024  Date of Birth: 01/11/1971 Study Type: HST  Age: 53 year MRN #: [id number]  Sex: Female Interpreting Physician: NEYSA RAMA, [id number]  Height: 5' 1 Referring Physician: Cleatus Debby Specking, MD  Weight: 162.0 lbs Recording Tech: Silvano Neeriemer RPSGT RST  BMI: 31.0 Scoring Tech: Holly Neeriemer RPSGT RST  ESS: 11 Neck Size: 14.25  %%startinterp%% %%startinterp%% Indications for Polysomnography The patient is a 53 year-old Female who is 5' 1 and weighs 162.0 lbs. Her BMI equals 31.0.  A home sleep apnea test was performed to evaluate for -.OSA  Medication  No Data.   Polysomnogram Data A home sleep test recorded the standard physiologic parameters including EKG, nasal and oral airflow.  Respiratory parameters of chest and abdominal movements were recorded with Respiratory Inductance Plethysmography belts.  Oxygen saturation was recorded by pulse oximetry.   Study Architecture The total recording time of the polysomnogram was 375.5 minutes.  The total monitoring time was 376.0 minutes.  Time spent in Supine position was 295.0 minutes.   Respiratory Events The study revealed a presence of 7 obstructive, - central, and - mixed apneas resulting in an Apnea index of 1.1 events per hour.  There were 118 hypopneas (>=3% desaturation and/or arousal) resulting in an Apnea\Hypopnea Index (AHI >=3% desaturation and/or arousal) of 19.9 events per hour.  There were 61 hypopneas (>=4% desaturation) resulting in an Apnea\Hypopnea Index (AHI >=4% desaturation) of 10.9 events per hour.  There were - Respiratory Effort Related Arousals resulting in a RERA index of - events per hour. The Respiratory Disturbance Index is 19.9 events per hour.  The snore index was 24.4 events per hour.  Mean oxygen  saturation was 96.0%.  The lowest oxygen saturation during monitoring time was 85.0%.  Time spent <=88% oxygen saturation was 0.9 minutes (0.2%).  Cardiac Summary The average pulse rate was 76.3 bpm.  The minimum pulse rate was 62.0 bpm while the maximum pulse rate was 104.0 bpm.    Comments: Moderate obstructive sleep apnea, AHI (3%) 19.9/hr. Snoring with oxygen desaturation to a nadir of 85%, mean 96%.  Diagnosis: Obstructive sleep apnea  Recommendations: Suggest autopap, cpap titration sleep study, or fitted oral appliance.   This study was personally reviewed and electronically signed by: Rama Neysa, MD Accredited Board Certified in Sleep Medicine Date/Time: 05/09/24   1:03    %%endinterp%% %%endinterp%%    Study Overview  Recording Time: 430.1 min. Monitoring Time: 376.0 min.  Analysis Start:  10:13:13 PM Supine Time: 295.0 min.  Analysis Stop:  04:28:43 AM     Study Summary   Count Index Longest Event Duration  Apneas & Hypopneas: 125 19.9  Apneas: 21.7 sec.     Hypopneas: 46.7 sec.  RERAs: - - - sec.  Desaturations: 126 20.1 122.0 sec.  Snores: 153 24.4 12.5 sec.    Minimum Oxygen Saturation: 85.0%    Respiratory Summary   Total Duration Supine Non-Supine   Count Index Average Longest Count Index Count Index  Obstructive Apnea 7 1.1 19.3 21.7 7 1.4 - -   Mixed Apnea - - - - - - - -   Central Apnea - - - - - - - -   Total Apneas 7 1.1 19.3 21.7 7 1.4 - -  Hypopneas 3% 118 18.8 N.A. N.A. 117 23.8 1 0.7   Apneas & Hyp. 3% 125 19.9 N.A. N.A. 124 25.2 1 0.7            Hypopneas 4% 61 9.7 N.A. N.A. 61 12.4 - -  Apneas & Hyp. 4% 68 10.9 N.A. N.A. 68 13.8 - -             RERAs - - - - - - - -  RDI 126 20.1 N.A. N.A. 125 25.4 1 0.7   Oxygen Saturation Summary   Total Supine Non-Supine  Average SpO2 96.0% 95.5% 98.1%  Minimum SpO2 85.0% 85.0% 95.0%   Maximum SpO2 100.0% 100.0% 100.0%   Oxygen Saturation Distribution  Range (%) Time in range  (min) Time in range (%)  90.0 - 100.0 373.6 99.4%  80.0 - 90.0 2.4 0.6%  70.0 - 80.0 - -  60.0 - 70.0 - -  50.0 - 60.0 - -  0.0 - 50.0 - -  Time Spent <=88% SpO2  Range (%) Time in range (min) Time in range (%)  0.0 - 88.0 0.9 0.2%  Cardiac Summary   Total Supine Non-Supine  Average Pulse Rate (BPM) 76.3 75.3 80.2  Minimum Pulse Rate (BPM) 62.0 62.0 67.0  Maximum Pulse Rate (BPM) 104.0 104.0 102.0                      Technologist Comments  -                          Reggy Neysa Bateman, Biomedical Engineer of Sleep Medicine  ELECTRONICALLY SIGNED ON:  05/09/2024, 1:00 PM [facility] PH: [phone]   FX: [phone] ACCREDITED BY THE [employer] OF SLEEP [employer]IC[employer]

## 2024-05-10 NOTE — Telephone Encounter (Signed)
 Copied from CRM [id number]. Topic: Clinical - Lab/Test Results >> May 10, 2024  4:33 PM Ashley R wrote: Reason for CRM: requesting callback [phone] for sleep study results

## 2024-05-10 NOTE — Progress Notes (Signed)
 I tried to call the patient's about the sleep study results.  It shows moderate obstructive sleep apnea.  I would like to offer her initiation of auto CPAP.  I have completed an order form and placed in MA basket.  Please send that to [facility] along with the results of the sleep study so they can work on english as a second language teacher.

## 2024-05-10 NOTE — Telephone Encounter (Signed)
 Pt requesting a call back.

## 2024-05-10 NOTE — Progress Notes (Signed)
 Notes and orders have been faxed

## 2024-05-11 NOTE — Progress Notes (Signed)
 I spoke with the patient by phone.  We talked about the sleep study results.  She is open to starting CPAP therapy.  I have placed orders for auto CPAP.

## 2024-05-28 ENCOUNTER — Encounter: Payer: Self-pay | Admitting: Student in an Organized Health Care Education/Training Program

## 2024-05-28 NOTE — Telephone Encounter (Signed)
 I have resent these orders to [facility] it can take 2 weeks to have this order processed. It can take up until next wednesday for the fax to come through per [facility].

## 2024-06-07 ENCOUNTER — Ambulatory Visit: Admitting: Student in an Organized Health Care Education/Training Program

## 2024-06-08 ENCOUNTER — Other Ambulatory Visit (HOSPITAL_COMMUNITY): Payer: Self-pay | Admitting: Obstetrics and Gynecology

## 2024-06-08 DIAGNOSIS — Z1231 Encounter for screening mammogram for malignant neoplasm of breast: Secondary | ICD-10-CM

## 2024-06-09 ENCOUNTER — Other Ambulatory Visit (HOSPITAL_COMMUNITY): Payer: Self-pay | Admitting: Obstetrics and Gynecology

## 2024-06-09 DIAGNOSIS — Z1231 Encounter for screening mammogram for malignant neoplasm of breast: Secondary | ICD-10-CM

## 2024-06-11 ENCOUNTER — Encounter (HOSPITAL_COMMUNITY): Payer: Self-pay

## 2024-06-11 ENCOUNTER — Encounter (HOSPITAL_COMMUNITY)

## 2024-06-11 ENCOUNTER — Ambulatory Visit (HOSPITAL_COMMUNITY)
Admission: RE | Admit: 2024-06-11 | Discharge: 2024-06-11 | Disposition: A | Source: Ambulatory Visit | Attending: Obstetrics and Gynecology | Admitting: Obstetrics and Gynecology

## 2024-06-11 DIAGNOSIS — Z1231 Encounter for screening mammogram for malignant neoplasm of breast: Secondary | ICD-10-CM

## 2024-06-16 ENCOUNTER — Ambulatory Visit (HOSPITAL_COMMUNITY)
